# Patient Record
Sex: Female | Born: 1965 | Race: Black or African American | Hispanic: No | Marital: Married | Smoking: Never smoker
Health system: Southern US, Community
[De-identification: ages and names within clinical notes are randomized; demographics above are authoritative.]

## PROBLEM LIST (undated history)

## (undated) DIAGNOSIS — R3129 Other microscopic hematuria: Secondary | ICD-10-CM

## (undated) DIAGNOSIS — E785 Hyperlipidemia, unspecified: Secondary | ICD-10-CM

## (undated) DIAGNOSIS — E669 Obesity, unspecified: Secondary | ICD-10-CM

## (undated) DIAGNOSIS — E119 Type 2 diabetes mellitus without complications: Secondary | ICD-10-CM

## (undated) DIAGNOSIS — E559 Vitamin D deficiency, unspecified: Secondary | ICD-10-CM

## (undated) DIAGNOSIS — R079 Chest pain, unspecified: Secondary | ICD-10-CM

## (undated) DIAGNOSIS — R5381 Other malaise: Secondary | ICD-10-CM

## (undated) DIAGNOSIS — I10 Essential (primary) hypertension: Secondary | ICD-10-CM

## (undated) HISTORY — PX: BREAST BIOPSY: SHX20

## (undated) HISTORY — DX: Other malaise: R53.81

## (undated) HISTORY — DX: Type 2 diabetes mellitus without complications: E11.9

## (undated) HISTORY — DX: Other microscopic hematuria: R31.29

## (undated) HISTORY — DX: Hyperlipidemia, unspecified: E78.5

## (undated) HISTORY — DX: Chest pain, unspecified: R07.9

## (undated) HISTORY — DX: Essential (primary) hypertension: I10

## (undated) HISTORY — PX: ABDOMINAL HYSTERECTOMY: SHX81

## (undated) HISTORY — DX: Vitamin D deficiency, unspecified: E55.9

## (undated) HISTORY — DX: Obesity, unspecified: E66.9

---

## 2000-07-11 HISTORY — PX: FRACTURE SURGERY: SHX138

## 2006-05-24 ENCOUNTER — Ambulatory Visit: Payer: Self-pay

## 2006-06-05 ENCOUNTER — Ambulatory Visit: Payer: Self-pay

## 2008-09-02 ENCOUNTER — Ambulatory Visit: Payer: Self-pay | Admitting: Family Medicine

## 2009-08-01 ENCOUNTER — Emergency Department: Payer: Self-pay | Admitting: Emergency Medicine

## 2009-11-09 ENCOUNTER — Emergency Department: Payer: Self-pay | Admitting: Emergency Medicine

## 2009-12-30 ENCOUNTER — Ambulatory Visit: Payer: Self-pay | Admitting: Obstetrics and Gynecology

## 2010-12-26 ENCOUNTER — Emergency Department: Payer: Self-pay | Admitting: Emergency Medicine

## 2011-05-05 LAB — HM PAP SMEAR

## 2011-05-16 ENCOUNTER — Ambulatory Visit: Payer: Self-pay | Admitting: Family Medicine

## 2011-08-04 ENCOUNTER — Ambulatory Visit: Payer: Self-pay | Admitting: Obstetrics and Gynecology

## 2011-08-04 LAB — CBC
HCT: 37.9 % (ref 35.0–47.0)
HGB: 12.7 g/dL (ref 12.0–16.0)
MCH: 27.5 pg (ref 26.0–34.0)
MCHC: 33.7 g/dL (ref 32.0–36.0)
MCV: 82 fL (ref 80–100)
RBC: 4.62 10*6/uL (ref 3.80–5.20)
WBC: 9.1 10*3/uL (ref 3.6–11.0)

## 2011-08-04 LAB — COMPREHENSIVE METABOLIC PANEL
Alkaline Phosphatase: 61 U/L (ref 50–136)
BUN: 15 mg/dL (ref 7–18)
Bilirubin,Total: 0.4 mg/dL (ref 0.2–1.0)
Calcium, Total: 8.7 mg/dL (ref 8.5–10.1)
Chloride: 103 mmol/L (ref 98–107)
Co2: 27 mmol/L (ref 21–32)
Creatinine: 0.85 mg/dL (ref 0.60–1.30)
EGFR (African American): >60
EGFR (Non-African Amer.): >60
Osmolality: 281 (ref 275–301)
Potassium: 3.4 mmol/L — ABNORMAL LOW (ref 3.5–5.1)
SGPT (ALT): 17 U/L
Sodium: 140 mmol/L (ref 136–145)
Total Protein: 7.7 g/dL (ref 6.4–8.2)

## 2011-09-01 ENCOUNTER — Ambulatory Visit: Payer: Self-pay | Admitting: Obstetrics and Gynecology

## 2011-09-02 LAB — HEMATOCRIT: HCT: 34.2 % — ABNORMAL LOW (ref 35.0–47.0)

## 2012-06-09 ENCOUNTER — Emergency Department: Payer: Self-pay | Admitting: Unknown Physician Specialty

## 2012-06-09 LAB — URINALYSIS, COMPLETE
Nitrite: NEGATIVE
Ph: 5 (ref 4.5–8.0)
Protein: NEGATIVE
RBC,UR: 5 /HPF (ref 0–5)
Squamous Epithelial: 4
WBC UR: 5 /HPF (ref 0–5)

## 2012-06-09 LAB — COMPREHENSIVE METABOLIC PANEL
Albumin: 3.6 g/dL (ref 3.4–5.0)
Alkaline Phosphatase: 87 U/L (ref 50–136)
BUN: 13 mg/dL (ref 7–18)
Bilirubin,Total: 0.2 mg/dL (ref 0.2–1.0)
Co2: 25 mmol/L (ref 21–32)
Creatinine: 0.93 mg/dL (ref 0.60–1.30)
EGFR (African American): >60
EGFR (Non-African Amer.): >60
Glucose: 134 mg/dL — ABNORMAL HIGH (ref 65–99)
Osmolality: 280 (ref 275–301)
Potassium: 3.3 mmol/L — ABNORMAL LOW (ref 3.5–5.1)
SGPT (ALT): 21 U/L (ref 12–78)
Sodium: 139 mmol/L (ref 136–145)
Total Protein: 8.1 g/dL (ref 6.4–8.2)

## 2012-06-09 LAB — CK TOTAL AND CKMB (NOT AT ARMC): CK, Total: 153 U/L (ref 21–215)

## 2012-06-09 LAB — CBC
HCT: 40.9 % (ref 35.0–47.0)
HGB: 13.3 g/dL (ref 12.0–16.0)
MCH: 26.9 pg (ref 26.0–34.0)
MCHC: 32.6 g/dL (ref 32.0–36.0)
MCV: 83 fL (ref 80–100)

## 2012-06-09 LAB — TROPONIN I: Troponin-I: <0.02 ng/mL

## 2012-12-19 LAB — LIPID PANEL
CHOLESTEROL: 212 mg/dL — AB (ref 0–200)
HDL: 61 mg/dL (ref 35–70)
LDL Cholesterol: 134 mg/dL
Triglycerides: 83 mg/dL (ref 40–160)

## 2012-12-19 LAB — HEMOGLOBIN A1C: Hgb A1c MFr Bld: 6.9 % — AB (ref 4.0–6.0)

## 2013-07-03 ENCOUNTER — Emergency Department: Payer: Self-pay | Admitting: Internal Medicine

## 2013-07-03 LAB — RAPID INFLUENZA A&B ANTIGENS

## 2013-07-05 LAB — BETA STREP CULTURE(ARMC)

## 2013-12-03 ENCOUNTER — Emergency Department: Payer: Self-pay | Admitting: Emergency Medicine

## 2013-12-03 LAB — URINALYSIS, COMPLETE
BILIRUBIN, UR: NEGATIVE
Glucose,UR: NEGATIVE mg/dL (ref 0–75)
Ketone: NEGATIVE
NITRITE: NEGATIVE
PROTEIN: NEGATIVE
Ph: 6 (ref 4.5–8.0)
RBC,UR: 7 /HPF (ref 0–5)
Specific Gravity: 1.016 (ref 1.003–1.030)
WBC UR: 1 /HPF (ref 0–5)

## 2013-12-03 LAB — CBC WITH DIFFERENTIAL/PLATELET
Basophil #: 0.1 10*3/uL (ref 0.0–0.1)
Basophil %: 0.5 %
Eosinophil #: 0.3 10*3/uL (ref 0.0–0.7)
Eosinophil %: 2.1 %
HCT: 43.1 % (ref 35.0–47.0)
HGB: 13.9 g/dL (ref 12.0–16.0)
LYMPHS ABS: 3.4 10*3/uL (ref 1.0–3.6)
Lymphocyte %: 28 %
MCH: 27.2 pg (ref 26.0–34.0)
MCHC: 32.3 g/dL (ref 32.0–36.0)
MCV: 84 fL (ref 80–100)
MONOS PCT: 8 %
Monocyte #: 1 x10 3/mm — ABNORMAL HIGH (ref 0.2–0.9)
NEUTROS ABS: 7.5 10*3/uL — AB (ref 1.4–6.5)
Neutrophil %: 61.4 %
Platelet: 317 10*3/uL (ref 150–440)
RBC: 5.11 10*6/uL (ref 3.80–5.20)
RDW: 15.1 % — ABNORMAL HIGH (ref 11.5–14.5)
WBC: 12.2 10*3/uL — AB (ref 3.6–11.0)

## 2013-12-03 LAB — COMPREHENSIVE METABOLIC PANEL
ALBUMIN: 3.7 g/dL (ref 3.4–5.0)
Alkaline Phosphatase: 77 U/L
Anion Gap: 7 (ref 7–16)
BILIRUBIN TOTAL: 0.4 mg/dL (ref 0.2–1.0)
BUN: 10 mg/dL (ref 7–18)
CREATININE: 0.83 mg/dL (ref 0.60–1.30)
Calcium, Total: 9.1 mg/dL (ref 8.5–10.1)
Chloride: 104 mmol/L (ref 98–107)
Co2: 27 mmol/L (ref 21–32)
EGFR (African American): >60
EGFR (Non-African Amer.): >60
GLUCOSE: 85 mg/dL (ref 65–99)
OSMOLALITY: 274 (ref 275–301)
Potassium: 3.3 mmol/L — ABNORMAL LOW (ref 3.5–5.1)
SGOT(AST): 14 U/L — ABNORMAL LOW (ref 15–37)
SGPT (ALT): 16 U/L (ref 12–78)
Sodium: 138 mmol/L (ref 136–145)
TOTAL PROTEIN: 8.1 g/dL (ref 6.4–8.2)

## 2013-12-03 LAB — LIPASE, BLOOD: LIPASE: 178 U/L (ref 73–393)

## 2013-12-03 LAB — TROPONIN I: Troponin-I: <0.02 ng/mL

## 2014-11-02 NOTE — Op Note (Signed)
PATIENT NAME:  Elizabeth Barrett, Elizabeth Barrett MR#:  [id number] DATE OF BIRTH:  09-Jul-1966  DATE OF PROCEDURE:  09/01/2011  PREOPERATIVE DIAGNOSES:  1. Fibroid uterus.  2. Dysmenorrhea.  3. Menorrhagia.   POSTOPERATIVE DIAGNOSES:  1. Fibroid uterus.  2. Dysmenorrhea.  3. Menorrhagia.   PROCEDURE: Laparoscopic supracervical hysterectomy.   SURGEON: Prentice Docker, MD  ASSISTANT: Verlene Mayer, MD  ANESTHESIA: General.   ESTIMATED BLOOD LOSS: 50 mL.  OPERATIVE FLUIDS: 2000 mL.   URINE OUTPUT: 200 mL clear fluid at the end of the procedure.   FINDINGS:  1. Large uterus with multiple fibroids.  2. Normal appearing ovaries bilaterally.   SPECIMEN: Uterus.   COMPLICATIONS: None.   INDICATIONS: Elizabeth Barrett is a 49 year old female who was seen in clinic by me for heavy bleeding. During part of her workup, she was noted to have multiple large uterine fibroids. She was counseled regarding the many different treatment modalities for this issue; however, ultimately she wished to have her uterus removed. She was therefore taken to the operating room.   DETAILS OF PROCEDURE: After the patient was met in the preoperative area, she was taken to the Operating Room. She was placed under general anesthesia, which was found to be adequate. She was positioned in the dorsal supine lithotomy position and prepped and draped in the usual sterile fashion. A sterile speculum was placed in the vagina and a single-tooth tenaculum was affixed to the anterior lip of the cervix and an acorn uterine manipulator was affixed to the tenaculum. The speculum was then removed and a Foley catheter was placed in her bladder.   Attention was turned to the abdomen where a 5 mm infraumbilical incision was made after injecting local anesthetic, and the abdomen was entered via the Optiview trocar method. Entry into the abdomen was verified using opening abdominal pressure. The abdomen was then insufflated with CO2. A left lower quadrant  port was then made under intra-abdominal direct visualization using a 5 mm trocar and a right lower quadrant port was made using an 11 mm trocar, in the same fashion. Attention was then turned to the uterus. The abdomen was surveyed with the above-noted findings. The right round ligament was then transected using the Harmonic followed by the right fallopian tube, the broad ligament was then dissected, and the uterine arteries were skeletonized at the level of the internal os. A bladder flap was then created and the bladder moved out of the operative area. The right uterine arteries were then cauterized using the Kleppinger bipolar cautery device and then transected using the Harmonic. This was completed in a similar fashion on the left side of the uterus. The uterus was then amputated from the cervix using the Harmonic scalpel. The endocervical canal was then cauterized using the Kleppinger bipolar cautery device.   The right lower quadrant port was then replaced with a 15 mm Gynecare morcellator port, and the morcellator was then introduced into the abdomen and the uterus was then removed using the morcellator in its entirety. After this, Arista was then placed over the cervical stump and the pedicle lines on both sides to assure continued hemostasis and then an adhesion barrier was then placed over the remaining cervical stump. The right lower quadrant port was then removed and the fascia was closed using a fascial closure device using  0 Vicryl, the left lower quadrant port was then removed, and the abdomen was desufflated, after ensuring hemostasis. All port sites were closed subcutaneously using 3-0 Monocryl. The right  lower quadrant skin was closed using 3-0 Monocryl in a subcuticular fashion and then all port sites, at the skin, were closed with Dermabond.   A speculum was placed in the vagina and the single-tooth tenaculum and acorn manipulator were removed from the cervix. Hemostasis was assured. The  speculum was then removed.   The patient tolerated the procedure well. Sponge, lap, and needle counts were correct x2. She was given antibiotic prophylaxis in the form of Ancef 2 grams prior to skin incision. She was given VTE prophylaxis in the form of pneumatic compression stockings that placed during the operation and prior to induction of anesthesia.  ____________________________ Will Bonnet, MD sdj:slb D: 09/01/2011 09:35:05 ET T: 09/01/2011 10:13:59 ET JOB#: [id number]  cc: Will Bonnet, MD, <Dictator> Will Bonnet MD ELECTRONICALLY SIGNED 10/02/2011 23:03

## 2015-01-23 ENCOUNTER — Telehealth: Payer: Self-pay

## 2015-01-23 NOTE — Telephone Encounter (Signed)
Patient called asking about her hysterectomy and informed patient to have records pulled from [facility] about procedure.

## 2015-02-16 ENCOUNTER — Encounter: Payer: Self-pay | Admitting: Family Medicine

## 2015-03-10 ENCOUNTER — Encounter: Payer: Self-pay | Admitting: Emergency Medicine

## 2015-03-10 ENCOUNTER — Emergency Department
Admission: EM | Admit: 2015-03-10 | Discharge: 2015-03-10 | Disposition: A | Discharge status: Home / Self Care | Payer: 59 | Attending: Emergency Medicine | Admitting: Emergency Medicine

## 2015-03-10 ENCOUNTER — Emergency Department: Payer: 59

## 2015-03-10 DIAGNOSIS — Y998 Other external cause status: Secondary | ICD-10-CM | POA: Diagnosis not present

## 2015-03-10 DIAGNOSIS — T24201A Burn of second degree of unspecified site of right lower limb, except ankle and foot, initial encounter: Secondary | ICD-10-CM | POA: Diagnosis not present

## 2015-03-10 DIAGNOSIS — Y9389 Activity, other specified: Secondary | ICD-10-CM | POA: Insufficient documentation

## 2015-03-10 DIAGNOSIS — T2125XA Burn of second degree of buttock, initial encounter: Secondary | ICD-10-CM | POA: Diagnosis not present

## 2015-03-10 DIAGNOSIS — T24211A Burn of second degree of right thigh, initial encounter: Secondary | ICD-10-CM

## 2015-03-10 DIAGNOSIS — Y9289 Other specified places as the place of occurrence of the external cause: Secondary | ICD-10-CM | POA: Insufficient documentation

## 2015-03-10 DIAGNOSIS — M62830 Muscle spasm of back: Secondary | ICD-10-CM | POA: Insufficient documentation

## 2015-03-10 DIAGNOSIS — Z23 Encounter for immunization: Secondary | ICD-10-CM | POA: Diagnosis not present

## 2015-03-10 DIAGNOSIS — W92XXXA Exposure to excessive heat of man-made origin, initial encounter: Secondary | ICD-10-CM | POA: Insufficient documentation

## 2015-03-10 MED ORDER — KETOROLAC TROMETHAMINE 30 MG/ML IJ SOLN
INTRAMUSCULAR | Status: AC
  Filled 2015-03-10: qty 1

## 2015-03-10 MED ORDER — HYDROCODONE-ACETAMINOPHEN 5-325 MG PO TABS
1.0000 | ORAL_TABLET | Freq: Once | ORAL | Status: DC
  Filled 2015-03-10: qty 1

## 2015-03-10 MED ORDER — SILVER SULFADIAZINE 1 % EX CREA
TOPICAL_CREAM | Freq: Once | CUTANEOUS | Status: AC
  Administered 2015-03-10: 16:00:00 via TOPICAL
  Filled 2015-03-10: qty 85

## 2015-03-10 MED ORDER — TIZANIDINE HCL 4 MG PO TABS: 4.0000 mg | ORAL_TABLET | Freq: Four times a day (QID) | ORAL | Status: DC | PRN

## 2015-03-10 MED ORDER — TETANUS-DIPHTH-ACELL PERTUSSIS 5-2.5-18.5 LF-MCG/0.5 IM SUSP
0.5000 mL | Freq: Once | INTRAMUSCULAR | Status: AC
  Administered 2015-03-10: 0.5 mL via INTRAMUSCULAR
  Filled 2015-03-10: qty 0.5

## 2015-03-10 MED ORDER — CEPHALEXIN 500 MG PO CAPS: 500.0000 mg | ORAL_CAPSULE | Freq: Four times a day (QID) | ORAL | Status: AC

## 2015-03-10 MED ORDER — SILVER SULFADIAZINE 1 % EX CREA: TOPICAL_CREAM | CUTANEOUS | Status: AC

## 2015-03-10 MED ORDER — KETOROLAC TROMETHAMINE 30 MG/ML IJ SOLN
30.0000 mg | Freq: Once | INTRAMUSCULAR | Status: AC
  Administered 2015-03-10: 30 mg via INTRAMUSCULAR

## 2015-03-10 MED ORDER — IBUPROFEN 800 MG PO TABS: 800.0000 mg | ORAL_TABLET | Freq: Three times a day (TID) | ORAL | Status: DC | PRN

## 2015-03-10 MED ORDER — HYDROCODONE-ACETAMINOPHEN 5-325 MG PO TABS: 1.0000 | ORAL_TABLET | Freq: Four times a day (QID) | ORAL | Status: DC | PRN

## 2015-03-10 NOTE — ED Provider Notes (Signed)
CSN: [id number]     Arrival date & time 03/10/15  1400 History   First MD Initiated Contact with Patient 03/10/15 1439     Chief Complaint  Patient presents with  . Back Pain    HPI Comments: 49 year old female presents today complaining of right sided back pain and flank pain for the past week. Pt reports she started having muscle spasms over a week ago and they have continued. Minimal relief from ibuprofen and muscle relaxer she got from a friend. No loss of bowel or bladder function. No genital or perianal numbness. No numbness or weakness down her legs.   Pt is also concerned because she burned her right hip on the heating pad over the weekend. She mistakenly fell asleep with it on her hip. The area blistered up and some fluid came out of it. She placed a bandage over it but she has not put any cream on it. Unsure of tetanus status.   Patient is a 49 y.o. female presenting with back pain. The history is provided by the patient.  Back Pain Location:  Lumbar spine and thoracic spine Quality:  Cramping Radiates to:  Does not radiate Pain severity:  Moderate Pain is:  Same all the time Onset quality:  Gradual Duration:  7 days Timing:  Intermittent Progression:  Worsening Chronicity:  New Context: not falling, not lifting heavy objects, not occupational injury and not recent injury   Relieved by:  None tried Worsened by:  Ambulation, movement, standing and twisting Ineffective treatments:  Being still, bed rest, NSAIDs, muscle relaxants and heating pad Associated symptoms: no bladder incontinence, no bowel incontinence, no dysuria, no leg pain, no numbness, no perianal numbness, no tingling and no weakness   Risk factors: lack of exercise, menopause and obesity     History reviewed. No pertinent past medical history. Past Surgical History  Procedure Laterality Date  . Abdominal hysterectomy     History reviewed. No pertinent family history. Social History  Substance Use Topics    . Smoking status: Never Smoker   . Smokeless tobacco: None  . Alcohol Use: No   OB History    No data available     Review of Systems  Gastrointestinal: Negative for bowel incontinence.  Genitourinary: Negative for bladder incontinence and dysuria.  Musculoskeletal: Positive for myalgias, back pain and arthralgias.  Skin: Positive for rash and wound.  Neurological: Negative for tingling, weakness and numbness.  All other systems reviewed and are negative.     Allergies  Review of patient's allergies indicates no known allergies.  Home Medications   Prior to Admission medications   Medication Sig Start Date End Date Taking? Authorizing Provider  cephALEXin (KEFLEX) 500 MG capsule Take 1 capsule (500 mg total) by mouth 4 (four) times daily. 03/10/15 03/20/15  Corliss Parish, PA-C  HYDROcodone-acetaminophen (NORCO/VICODIN) 5-325 MG per tablet Take 1 tablet by mouth every 6 (six) hours as needed for severe pain. 03/10/15   Corliss Parish, PA-C  ibuprofen (ADVIL,MOTRIN) 800 MG tablet Take 1 tablet (800 mg total) by mouth every 8 (eight) hours as needed. 03/10/15   Corliss Parish, PA-C  silver sulfADIAZINE (SILVADENE) 1 % cream Apply to affected area BID 03/10/15 03/09/16  Shayne Alken V, PA-C  tiZANidine (ZANAFLEX) 4 MG tablet Take 1 tablet (4 mg total) by mouth every 6 (six) hours as needed for muscle spasms. 03/10/15   Shayne Alken V, PA-C   BP 181/89 mmHg  Pulse 101  Temp(Src)  98.2 F (36.8 C) (Oral)  Resp 18  Ht 5\' 5"  (1.651 m)  Wt 250 lb (113.399 kg)  BMI 41.60 kg/m2  SpO2 97% Physical Exam  Constitutional: She is oriented to person, place, and time. Vital signs are normal. She appears well-developed and well-nourished.  HENT:  Head: Normocephalic and atraumatic.  Abdominal: Soft. Bowel sounds are normal. She exhibits no distension. There is no tenderness. There is no rebound and no guarding.  Musculoskeletal:       Right hip: Normal.       Left hip: Normal.       Lumbar back:  She exhibits decreased range of motion. She exhibits no bony tenderness.  Right sided thoracic and lumbar muscle tenderness with spasms noted.   Neurological: She is alert and oriented to person, place, and time. She has normal strength. No sensory deficit. She exhibits normal muscle tone. Gait normal.  Skin: Skin is warm. Burn noted. There is erythema.     Second degree burns noted just above right buttocks.   Psychiatric: She has a normal mood and affect. Her behavior is normal. Judgment and thought content normal.  Nursing note and vitals reviewed.   ED Course  Procedures (including critical care time) Labs Review Labs Reviewed - No data to display  Imaging Review Dg Lumbar Spine 2-3 Views  03/10/2015   CLINICAL DATA:  Lumbago for 2 weeks  EXAM: LUMBAR SPINE - 2-3 VIEW  COMPARISON:  None.  FINDINGS: Frontal, lateral, and spot lumbosacral lateral images were obtained. There are 4 strictly non-rib-bearing lumbar type vertebral bodies. There is a transitional lumbosacral vertebra. There is no fracture or spondylolisthesis. Disc spaces appear intact. No erosive change.  IMPRESSION: No fracture or spondylolisthesis. No appreciable arthropathic change.   Electronically Signed   By: Lowella Grip III M.D.   On: 03/10/2015 15:31   I have personally reviewed and evaluated these images and lab results as part of my medical decision-making.   EKG Interpretation None      MDM  Normal lumbar spine XRAY Toradol 30mg  IM given in ER Short course of Norco as needed for pain - drowsiness warning given Zanaflex QHS as needed - drowsiness warning given Silvadene applied to burn in ER. Tdap updated Continue silvadene BID - Keflex 500mg  QID to cover for infection Follow up with PCP in 2 days to re-check burn and back spasm  Final diagnoses:  Second degree burn of right hip, initial encounter  Muscle spasm of back        Santo Held 03/10/15 1614  Ahmed Prima, MD 03/11/15  2253

## 2015-03-10 NOTE — Discharge Instructions (Signed)

## 2015-03-10 NOTE — ED Notes (Addendum)
Reports spasms in right lower back.  Denies urinary sx.  x 1 wk.  Worse with movement.

## 2015-03-17 ENCOUNTER — Ambulatory Visit: Payer: Self-pay | Admitting: Family Medicine

## 2015-03-18 ENCOUNTER — Ambulatory Visit (INDEPENDENT_AMBULATORY_CARE_PROVIDER_SITE_OTHER): Payer: 59 | Admitting: Family Medicine

## 2015-03-18 ENCOUNTER — Encounter: Payer: Self-pay | Admitting: Family Medicine

## 2015-03-18 VITALS — BP 132/86 | HR 94 | Temp 99.1°F | Resp 16 | Ht 64.5 in | Wt 254.5 lb

## 2015-03-18 DIAGNOSIS — M549 Dorsalgia, unspecified: Secondary | ICD-10-CM | POA: Insufficient documentation

## 2015-03-18 DIAGNOSIS — E119 Type 2 diabetes mellitus without complications: Secondary | ICD-10-CM | POA: Diagnosis not present

## 2015-03-18 DIAGNOSIS — T300 Burn of unspecified body region, unspecified degree: Secondary | ICD-10-CM

## 2015-03-18 DIAGNOSIS — E1169 Type 2 diabetes mellitus with other specified complication: Secondary | ICD-10-CM | POA: Insufficient documentation

## 2015-03-18 DIAGNOSIS — E559 Vitamin D deficiency, unspecified: Secondary | ICD-10-CM | POA: Diagnosis not present

## 2015-03-18 DIAGNOSIS — Z23 Encounter for immunization: Secondary | ICD-10-CM

## 2015-03-18 DIAGNOSIS — E785 Hyperlipidemia, unspecified: Secondary | ICD-10-CM | POA: Diagnosis not present

## 2015-03-18 DIAGNOSIS — Z79899 Other long term (current) drug therapy: Secondary | ICD-10-CM

## 2015-03-18 LAB — POCT UA - MICROALBUMIN: Microalbumin Ur, POC: 100 mg/L

## 2015-03-18 LAB — POCT GLYCOSYLATED HEMOGLOBIN (HGB A1C): Hemoglobin A1C: 6.2

## 2015-03-18 MED ORDER — TIZANIDINE HCL 4 MG PO TABS: 4.0000 mg | ORAL_TABLET | Freq: Four times a day (QID) | ORAL | Status: DC | PRN

## 2015-03-18 MED ORDER — HYDROCODONE-ACETAMINOPHEN 5-325 MG PO TABS: 1.0000 | ORAL_TABLET | Freq: Four times a day (QID) | ORAL | Status: DC | PRN

## 2015-03-18 NOTE — Progress Notes (Signed)
Name: Elizabeth Barrett   MRN: [id number]    DOB: 06/01/1966   Date:03/18/2015       Progress Note  Subjective  Chief Complaint  Chief Complaint  Patient presents with  . Medication Refill  . Diabetes    Patient states she has been having foot pain.    HPI  Acute low back pain: started about one week ago, pain was severe, difficulty walking and slept with heating pad that caused second degree burn on her skin. She went to [facility], feeling better, taking Ibuprofen, Hydrocodone and Tizanidine. Her pain right now is a 2/10. No symptoms of radiculitis. EC did X-ray and was negative.   Burn: 2nd degree, doing better, no signs of infection, using medication as needed  DMII: lost to follow up, past two years because of gap in insurance, hgbA1C is at goal today, continue life style modification. No polyphagia, polydipsia or polyuria  Obesity: she has changed diet to Atkins diet over the past couple of months, but weight fluctuates. Discussed other diets with her today.    Patient Active Problem List   Diagnosis Date Noted  . Type 2 diabetes, diet controlled 03/18/2015  . Obesity, Class III, BMI 40-49.9 (morbid obesity) 03/18/2015  . Vitamin D deficiency 03/18/2015  . Burn 03/18/2015  . Acute back pain 03/18/2015    Past Surgical History  Procedure Laterality Date  . Abdominal hysterectomy    . Fracture surgery Right 2002    elbow     Family History  Problem Relation Age of Onset  . Hypertension Mother   . Cancer Father     luekemia  . Heart disease Father   . Diabetes Father   . Asthma Son   . Diabetes Son     Social History   Social History  . Marital Status: Married    Spouse Name: N/A  . Number of Children: N/A  . Years of Education: N/A   Occupational History  . Not on file.   Social History Main Topics  . Smoking status: Never Smoker   . Smokeless tobacco: Never Used  . Alcohol Use: 0.0 oz/week    0 Standard drinks or equivalent per week     Comment: occasionally  drinks wine  . Drug Use: No  . Sexual Activity:    Partners: Male   Other Topics Concern  . Not on file   Social History Narrative     Current outpatient prescriptions:  .  cephALEXin (KEFLEX) 500 MG capsule, Take 1 capsule (500 mg total) by mouth 4 (four) times daily., Disp: 40 capsule, Rfl: 0 .  HYDROcodone-acetaminophen (NORCO/VICODIN) 5-325 MG per tablet, Take 1 tablet by mouth every 6 (six) hours as needed for severe pain., Disp: 20 tablet, Rfl: 0 .  ibuprofen (ADVIL,MOTRIN) 800 MG tablet, Take 1 tablet (800 mg total) by mouth every 8 (eight) hours as needed., Disp: 15 tablet, Rfl: 0 .  silver sulfADIAZINE (SILVADENE) 1 % cream, Apply to affected area BID, Disp: 50 g, Rfl: 1 .  tiZANidine (ZANAFLEX) 4 MG tablet, Take 1 tablet (4 mg total) by mouth every 6 (six) hours as needed for muscle spasms., Disp: 60 tablet, Rfl: 0  No Known Allergies   ROS  Constitutional: Negative for fever or weight change.  Respiratory: Negative for cough and shortness of breath.   Cardiovascular: Negative for chest pain or palpitations.  Gastrointestinal: Negative for abdominal pain, no bowel changes.  Musculoskeletal: Negative for gait problem or joint swelling.  Skin: Negative  for rash.  Neurological: Negative for dizziness or headache.  No other specific complaints in a complete review of systems (except as listed in HPI above).  Objective  Filed Vitals:   03/18/15 1111  BP: 132/86  Pulse: 94  Temp: 99.1 F (37.3 C)  TempSrc: Oral  Resp: 16  Height: 5' 4.5" (1.638 m)  Weight: 254 lb 8 oz (115.44 kg)  SpO2: 97%    Body mass index is 43.03 kg/(m^2).  Physical Exam  Constitutional: Patient appears well-developed and well-nourished. Obese  No distress.  HEENT: head atraumatic, normocephalic, pupils equal and reactive to light,neck supple, throat within normal limits Cardiovascular: Normal rate, regular rhythm and normal heart sounds.  No murmur heard. No BLE edema. Pulmonary/Chest:  Effort normal and breath sounds normal. No respiratory distress. Abdominal: Soft.  There is no tenderness. Psychiatric: Patient has a normal mood and affect. behavior is normal. Judgment and thought content normal. Skin: healing 2nd degree burn on right lower back and flank area Muscular Skeletal: right low back pain, negative straight leg raise  Recent Results (from the past 2160 hour(s))  POCT HgB A1C     Status: None   Collection Time: 03/18/15 11:23 AM  Result Value Ref Range   Hemoglobin A1C 6.2   POCT UA - Microalbumin     Status: None   Collection Time: 03/18/15 11:24 AM  Result Value Ref Range   Microalbumin Ur, POC 100 mg/L   Creatinine, POC  mg/dL   Albumin/Creatinine Ratio, Urine, POC      Diabetic Foot Exam: Diabetic Foot Exam - Simple   Simple Foot Form  Visual Inspection  No deformities, no ulcerations, no other skin breakdown bilaterally:  Yes  Sensation Testing  Intact to touch and monofilament testing bilaterally:  Yes  Pulse Check  Posterior Tibialis and Dorsalis pulse intact bilaterally:  Yes  Comments       PHQ2/9: Depression screen PHQ 2/9 03/18/2015  Decreased Interest 0  Down, Depressed, Hopeless 0  PHQ - 2 Score 0    Fall Risk: Fall Risk  03/18/2015  Falls in the past year? No      Assessment & Plan  1. Type 2 diabetes mellitus without complication Diet controlled, explained importance of having yearly exam - POCT HgB A1C - POCT UA - Microalbumin  2. Needs flu shot  - Flu Vaccine QUAD 36+ mos IM  3. Obesity, Class III, BMI 40-49.9 (morbid obesity) Discussed with the patient the risk posed by an increased BMI. Discussed importance of portion control, calorie counting and at least 150 minutes of physical activity weekly. Avoid sweet beverages and drink more water. Eat at least 6 servings of fruit and vegetables daily   4. Vitamin D deficiency  - Vit D  25 hydroxy (rtn osteoporosis monitoring)  5. Burn Doing well  6. Acute back  pain Doing better, discussed PT or chiropractor to avoid recurrence but she wants to hold off - tiZANidine (ZANAFLEX) 4 MG tablet; Take 1 tablet (4 mg total) by mouth every 6 (six) hours as needed for muscle spasms.  Dispense: 60 tablet; Refill: 0 - HYDROcodone-acetaminophen (NORCO/VICODIN) 5-325 MG per tablet; Take 1 tablet by mouth every 6 (six) hours as needed for severe pain.  Dispense: 20 tablet; Refill: 0  7. Dyslipidemia  - Lipid panel  8. Long-term use of high-risk medication  - Comprehensive metabolic panel  9. Need for pneumococcal vaccination  - Pneumococcal polysaccharide vaccine 23-valent greater than or equal to 2yo subcutaneous/IM

## 2015-05-28 ENCOUNTER — Encounter: Payer: 59 | Admitting: Family Medicine

## 2015-08-25 ENCOUNTER — Encounter: Payer: 59 | Admitting: Family Medicine

## 2015-09-16 ENCOUNTER — Ambulatory Visit: Payer: 59 | Admitting: Family Medicine

## 2016-02-10 ENCOUNTER — Other Ambulatory Visit: Payer: Self-pay | Admitting: Internal Medicine

## 2016-02-10 DIAGNOSIS — Z1239 Encounter for other screening for malignant neoplasm of breast: Secondary | ICD-10-CM

## 2016-03-03 ENCOUNTER — Ambulatory Visit: Payer: 59 | Attending: Internal Medicine

## 2016-03-08 ENCOUNTER — Ambulatory Visit
Admission: RE | Admit: 2016-03-08 | Discharge: 2016-03-08 | Disposition: A | Discharge status: Home / Self Care | Payer: 59 | Source: Ambulatory Visit | Attending: Internal Medicine | Admitting: Internal Medicine

## 2016-03-08 ENCOUNTER — Other Ambulatory Visit: Payer: Self-pay | Admitting: Internal Medicine

## 2016-03-08 DIAGNOSIS — Z1239 Encounter for other screening for malignant neoplasm of breast: Secondary | ICD-10-CM

## 2016-03-08 DIAGNOSIS — Z1231 Encounter for screening mammogram for malignant neoplasm of breast: Secondary | ICD-10-CM | POA: Insufficient documentation

## 2016-04-20 ENCOUNTER — Ambulatory Visit: Admission: RE | Admit: 2016-04-20 | Payer: 59 | Source: Ambulatory Visit | Admitting: Unknown Physician Specialty

## 2016-04-20 ENCOUNTER — Encounter: Admission: RE | Payer: Self-pay | Source: Ambulatory Visit

## 2016-04-20 SURGERY — COLONOSCOPY WITH PROPOFOL
Anesthesia: General

## 2016-07-25 ENCOUNTER — Encounter
Admission: RE | Disposition: A | Discharge status: Home / Self Care | Payer: Self-pay | Source: Ambulatory Visit | Attending: Unknown Physician Specialty

## 2016-07-25 ENCOUNTER — Ambulatory Visit: Payer: 59 | Admitting: Anesthesiology

## 2016-07-25 ENCOUNTER — Encounter: Payer: Self-pay | Admitting: *Deleted

## 2016-07-25 ENCOUNTER — Ambulatory Visit
Admission: RE | Admit: 2016-07-25 | Discharge: 2016-07-25 | Disposition: A | Discharge status: Home / Self Care | Payer: 59 | Source: Ambulatory Visit | Attending: Unknown Physician Specialty | Admitting: Unknown Physician Specialty

## 2016-07-25 DIAGNOSIS — K640 First degree hemorrhoids: Secondary | ICD-10-CM | POA: Insufficient documentation

## 2016-07-25 DIAGNOSIS — Z90710 Acquired absence of both cervix and uterus: Secondary | ICD-10-CM | POA: Diagnosis not present

## 2016-07-25 DIAGNOSIS — K648 Other hemorrhoids: Secondary | ICD-10-CM | POA: Diagnosis not present

## 2016-07-25 DIAGNOSIS — K635 Polyp of colon: Secondary | ICD-10-CM | POA: Diagnosis not present

## 2016-07-25 DIAGNOSIS — E559 Vitamin D deficiency, unspecified: Secondary | ICD-10-CM | POA: Insufficient documentation

## 2016-07-25 DIAGNOSIS — K5730 Diverticulosis of large intestine without perforation or abscess without bleeding: Secondary | ICD-10-CM | POA: Diagnosis not present

## 2016-07-25 DIAGNOSIS — R3129 Other microscopic hematuria: Secondary | ICD-10-CM | POA: Insufficient documentation

## 2016-07-25 DIAGNOSIS — D122 Benign neoplasm of ascending colon: Secondary | ICD-10-CM | POA: Insufficient documentation

## 2016-07-25 DIAGNOSIS — Z806 Family history of leukemia: Secondary | ICD-10-CM | POA: Diagnosis not present

## 2016-07-25 DIAGNOSIS — Z825 Family history of asthma and other chronic lower respiratory diseases: Secondary | ICD-10-CM | POA: Diagnosis not present

## 2016-07-25 DIAGNOSIS — Z79899 Other long term (current) drug therapy: Secondary | ICD-10-CM | POA: Diagnosis not present

## 2016-07-25 DIAGNOSIS — Z8249 Family history of ischemic heart disease and other diseases of the circulatory system: Secondary | ICD-10-CM | POA: Insufficient documentation

## 2016-07-25 DIAGNOSIS — Z833 Family history of diabetes mellitus: Secondary | ICD-10-CM | POA: Diagnosis not present

## 2016-07-25 DIAGNOSIS — E785 Hyperlipidemia, unspecified: Secondary | ICD-10-CM | POA: Diagnosis not present

## 2016-07-25 DIAGNOSIS — E119 Type 2 diabetes mellitus without complications: Secondary | ICD-10-CM | POA: Insufficient documentation

## 2016-07-25 DIAGNOSIS — Z1211 Encounter for screening for malignant neoplasm of colon: Secondary | ICD-10-CM | POA: Diagnosis not present

## 2016-07-25 DIAGNOSIS — Z6841 Body Mass Index (BMI) 40.0 and over, adult: Secondary | ICD-10-CM | POA: Diagnosis not present

## 2016-07-25 DIAGNOSIS — Z8371 Family history of colonic polyps: Secondary | ICD-10-CM | POA: Diagnosis not present

## 2016-07-25 DIAGNOSIS — I10 Essential (primary) hypertension: Secondary | ICD-10-CM | POA: Insufficient documentation

## 2016-07-25 DIAGNOSIS — E669 Obesity, unspecified: Secondary | ICD-10-CM | POA: Insufficient documentation

## 2016-07-25 HISTORY — PX: COLONOSCOPY WITH PROPOFOL: SHX5780

## 2016-07-25 SURGERY — COLONOSCOPY WITH PROPOFOL
Anesthesia: General

## 2016-07-25 MED ORDER — MIDAZOLAM HCL 5 MG/5ML IJ SOLN
INTRAMUSCULAR | Status: DC | PRN
  Administered 2016-07-25 (×2): 1 mg via INTRAVENOUS

## 2016-07-25 MED ORDER — PROPOFOL 500 MG/50ML IV EMUL
INTRAVENOUS | Status: AC
  Filled 2016-07-25: qty 50

## 2016-07-25 MED ORDER — PROPOFOL 500 MG/50ML IV EMUL
INTRAVENOUS | Status: DC | PRN
  Administered 2016-07-25: 75 ug/kg/min via INTRAVENOUS

## 2016-07-25 MED ORDER — SODIUM CHLORIDE 0.9 % IV SOLN: INTRAVENOUS | Status: DC

## 2016-07-25 MED ORDER — LIDOCAINE 2% (20 MG/ML) 5 ML SYRINGE
INTRAMUSCULAR | Status: AC
  Filled 2016-07-25: qty 5

## 2016-07-25 MED ORDER — PROPOFOL 10 MG/ML IV BOLUS
INTRAVENOUS | Status: DC | PRN
  Administered 2016-07-25: 30 mg via INTRAVENOUS
  Administered 2016-07-25: 20 mg via INTRAVENOUS

## 2016-07-25 MED ORDER — FENTANYL CITRATE (PF) 100 MCG/2ML IJ SOLN
INTRAMUSCULAR | Status: DC | PRN
  Administered 2016-07-25: 50 ug via INTRAVENOUS

## 2016-07-25 MED ORDER — FENTANYL CITRATE (PF) 100 MCG/2ML IJ SOLN
INTRAMUSCULAR | Status: AC
  Filled 2016-07-25: qty 2

## 2016-07-25 MED ORDER — LIDOCAINE HCL (PF) 2 % IJ SOLN
INTRAMUSCULAR | Status: DC | PRN
  Administered 2016-07-25: 50 mg

## 2016-07-25 MED ORDER — SODIUM CHLORIDE 0.9 % IV SOLN
INTRAVENOUS | Status: DC
  Administered 2016-07-25: 10:00:00 via INTRAVENOUS

## 2016-07-25 MED ORDER — MIDAZOLAM HCL 2 MG/2ML IJ SOLN
INTRAMUSCULAR | Status: AC
  Filled 2016-07-25: qty 2

## 2016-07-25 NOTE — H&P (Signed)
   Primary Care Physician:  Glendon Axe, MD Primary Gastroenterologist:  Dr. Vira Agar  Pre-Procedure History & Physical: HPI:  Elizabeth Barrett is a 51 y.o. female is here for an colonoscopy.   Past Medical History:  Diagnosis Date  . Chest pain   . Diabetes mellitus without complication ([place])   . Hyperlipidemia   . Hypertension   . Malaise   . Microscopic hematuria   . Obesity   . Vitamin D deficiency     Past Surgical History:  Procedure Laterality Date  . ABDOMINAL HYSTERECTOMY    . BREAST BIOPSY Left 6-7 YRS AGO   NEG  . FRACTURE SURGERY Right 2002   elbow     Prior to Admission medications   Medication Sig Start Date End Date Taking? Authorizing Provider  HYDROcodone-acetaminophen (NORCO/VICODIN) 5-325 MG per tablet Take 1 tablet by mouth every 6 (six) hours as needed for severe pain. Patient not taking: Reported on 07/25/2016 03/18/15   Steele Sizer, MD  ibuprofen (ADVIL,MOTRIN) 800 MG tablet Take 1 tablet (800 mg total) by mouth every 8 (eight) hours as needed. 03/10/15   Harvest Dark, PA-C  tiZANidine (ZANAFLEX) 4 MG tablet Take 1 tablet (4 mg total) by mouth every 6 (six) hours as needed for muscle spasms. Patient not taking: Reported on 07/25/2016 03/18/15   Steele Sizer, MD    Allergies as of 07/07/2016  . (No Known Allergies)    Family History  Problem Relation Age of Onset  . Hypertension Mother   . Cancer Father     luekemia  . Heart disease Father   . Diabetes Father   . Asthma Son   . Diabetes Son   . Breast cancer Neg Hx     Social History   Social History  . Marital status: Married    Spouse name: N/A  . Number of children: N/A  . Years of education: N/A   Occupational History  . Not on file.   Social History Main Topics  . Smoking status: Never Smoker  . Smokeless tobacco: Never Used  . Alcohol use 0.0 oz/week     Comment: occasionally drinks wine  . Drug use: No  . Sexual activity: Yes    Partners: Male   Other Topics Concern   . Not on file   Social History Narrative  . No narrative on file    Review of Systems: See HPI, otherwise negative ROS  Physical Exam: BP (!) 116/92   Pulse 86   Temp 97.8 F (36.6 C) (Tympanic)   Resp 16   Ht 5\' 4"  (1.626 m)   Wt 115.2 kg (254 lb)   SpO2 95%   BMI 43.60 kg/m  General:   Alert,  pleasant and cooperative in NAD Head:  Normocephalic and atraumatic. Neck:  Supple; no masses or thyromegaly. Lungs:  Clear throughout to auscultation.    Heart:  Regular rate and rhythm. Abdomen:  Soft, nontender and nondistended. Normal bowel sounds, without guarding, and without rebound.   Neurologic:  Alert and  oriented x4;  grossly normal neurologically.  Impression/Plan: Elizabeth Barrett is here for an colonoscopy to be performed for screening due to family history of colon polyps in both parents.  Risks, benefits, limitations, and alternatives regarding  colonoscopy have been reviewed with the patient.  Questions have been answered.  All parties agreeable.   Gaylyn Cheers, MD  07/25/2016, 10:58 AM

## 2016-07-25 NOTE — Op Note (Signed)
[facility] Gastroenterology Patient Name: Elizabeth Barrett Procedure Date: 07/25/2016 10:59 AM MRN: [id number] Account #: [id number] Date of Birth: [birth year] Admit Type: Outpatient Age: 51 Room: [facility] ENDO ROOM 1 Gender: Female Note Status: Finalized Procedure:            Colonoscopy Indications:          Colon cancer screening in patient at increased risk:                        Family history of 1st-degree relative with colon polyps Providers:            Manya Silvas, MD Referring MD:         Glendon Axe (Referring MD) Medicines:            Propofol per Anesthesia Complications:        No immediate complications. Procedure:            Pre-Anesthesia Assessment:                       - After reviewing the risks and benefits, the patient                        was deemed in satisfactory condition to undergo the                        procedure.                       After obtaining informed consent, the colonoscope was                        passed under direct vision. Throughout the procedure,                        the patient's blood pressure, pulse, and oxygen                        saturations were monitored continuously. The                        Colonoscope was introduced through the anus and                        advanced to the the cecum, identified by appendiceal                        orifice and ileocecal valve. The colonoscopy was                        performed without difficulty. The patient tolerated the                        procedure well. The quality of the bowel preparation                        was excellent. Findings:      A small polyp was found in the ascending colon. The polyp was sessile.       The polyp was removed with a hot snare. Resection and retrieval were       complete.      A  single small-mouthed diverticulum was found in the sigmoid colon.      Internal hemorrhoids were found during endoscopy. The hemorrhoids were      small and Grade I (internal hemorrhoids that do not prolapse).      The exam was otherwise without abnormality. Impression:           - One small polyp in the ascending colon, removed with                        a hot snare. Resected and retrieved.                       - Diverticulosis in the sigmoid colon.                       - Internal hemorrhoids.                       - The examination was otherwise normal. Recommendation:       - Await pathology results. Manya Silvas, MD 07/25/2016 11:25:31 AM This report has been signed electronically. Number of Addenda: 0 Note Initiated On: 07/25/2016 10:59 AM Scope Withdrawal Time: 0 hours 12 minutes 32 seconds  Total Procedure Duration: 0 hours 16 minutes 59 seconds       [facility]

## 2016-07-25 NOTE — Anesthesia Postprocedure Evaluation (Signed)
Anesthesia Post Note  Patient: Elizabeth Barrett  Procedure(s) Performed: Procedure(s) (LRB): COLONOSCOPY WITH PROPOFOL (N/A)  Patient location during evaluation: Endoscopy Anesthesia Type: General Level of consciousness: awake and alert Pain management: pain level controlled Vital Signs Assessment: post-procedure vital signs reviewed and stable Respiratory status: spontaneous breathing, nonlabored ventilation, respiratory function stable and patient connected to nasal cannula oxygen Cardiovascular status: blood pressure returned to baseline and stable Postop Assessment: no signs of nausea or vomiting Anesthetic complications: no     Last Vitals:  Vitals:   07/25/16 1149 07/25/16 1159  BP: (!) 150/80 132/90  Pulse: 72 76  Resp: 18 (!) 24  Temp:      Last Pain:  Vitals:   07/25/16 1129  TempSrc: Tympanic                 Precious Haws Kellie Murrill

## 2016-07-25 NOTE — Transfer of Care (Signed)
Immediate Anesthesia Transfer of Care Note  Patient: Elizabeth Barrett  Procedure(s) Performed: Procedure(s): COLONOSCOPY WITH PROPOFOL (N/A)  Patient Location: PACU  Anesthesia Type:General  Level of Consciousness: sedated  Airway & Oxygen Therapy: Patient Spontanous Breathing and Patient connected to nasal cannula oxygen  Post-op Assessment: Report given to RN and Post -op Vital signs reviewed and stable  Post vital signs: Reviewed and stable  Last Vitals:  Vitals:   07/25/16 1000 07/25/16 1129  BP: (!) 116/92   Pulse: 86   Resp: 16   Temp: 36.6 C (P) 36.5 C    Last Pain:  Vitals:   07/25/16 1129  TempSrc: (P) Tympanic         Complications: No apparent anesthesia complications

## 2016-07-25 NOTE — Anesthesia Preprocedure Evaluation (Addendum)
Anesthesia Evaluation  Patient identified by MRN, date of birth, ID band Patient awake    Reviewed: Allergy & Precautions, H&P , NPO status , Patient's Chart, lab work & pertinent test results  History of Anesthesia Complications Negative for: history of anesthetic complications  Airway Mallampati: II  TM Distance: >3 FB Neck ROM: full    Dental  (+) Poor Dentition, Chipped   Pulmonary neg pulmonary ROS, neg shortness of breath,    Pulmonary exam normal breath sounds clear to auscultation       Cardiovascular Exercise Tolerance: Good hypertension, (-) angina(-) Past MI and (-) DOE Normal cardiovascular exam Rhythm:regular Rate:Normal     Neuro/Psych negative neurological ROS  negative psych ROS   GI/Hepatic negative GI ROS, Neg liver ROS, neg GERD  ,  Endo/Other  diabetes, Type 2  Renal/GU negative Renal ROS  negative genitourinary   Musculoskeletal   Abdominal   Peds  Hematology negative hematology ROS (+)   Anesthesia Other Findings Signs and symptoms suggestive of sleep apnea   Past Medical History: No date: Chest pain No date: Diabetes mellitus without complication (HCC) No date: Hyperlipidemia No date: Hypertension No date: Malaise No date: Microscopic hematuria No date: Obesity No date: Vitamin D deficiency  Past Surgical History: No date: ABDOMINAL HYSTERECTOMY 6-7 YRS AGO: BREAST BIOPSY Left     Comment: NEG 2002: FRACTURE SURGERY Right     Comment: elbow   BMI    Body Mass Index:  43.60 kg/m      Reproductive/Obstetrics negative OB ROS                            Anesthesia Physical Anesthesia Plan  ASA: III  Anesthesia Plan: General   Post-op Pain Management:    Induction:   Airway Management Planned:   Additional Equipment:   Intra-op Plan:   Post-operative Plan:   Informed Consent: I have reviewed the patients History and Physical, chart, labs  and discussed the procedure including the risks, benefits and alternatives for the proposed anesthesia with the patient or authorized representative who has indicated his/her understanding and acceptance.   Dental Advisory Given  Plan Discussed with: Anesthesiologist, CRNA and Surgeon  Anesthesia Plan Comments:         Anesthesia Quick Evaluation

## 2016-07-26 ENCOUNTER — Encounter: Payer: Self-pay | Admitting: Unknown Physician Specialty

## 2016-07-26 LAB — SURGICAL PATHOLOGY

## 2017-07-23 DIAGNOSIS — J40 Bronchitis, not specified as acute or chronic: Secondary | ICD-10-CM | POA: Diagnosis not present

## 2017-08-18 DIAGNOSIS — R072 Precordial pain: Secondary | ICD-10-CM | POA: Diagnosis not present

## 2017-08-18 DIAGNOSIS — E119 Type 2 diabetes mellitus without complications: Secondary | ICD-10-CM | POA: Diagnosis not present

## 2017-08-18 DIAGNOSIS — I10 Essential (primary) hypertension: Secondary | ICD-10-CM | POA: Diagnosis not present

## 2017-08-23 DIAGNOSIS — I10 Essential (primary) hypertension: Secondary | ICD-10-CM | POA: Diagnosis not present

## 2017-08-23 DIAGNOSIS — R0602 Shortness of breath: Secondary | ICD-10-CM | POA: Diagnosis not present

## 2017-08-23 DIAGNOSIS — I208 Other forms of angina pectoris: Secondary | ICD-10-CM | POA: Diagnosis not present

## 2017-09-04 DIAGNOSIS — R0602 Shortness of breath: Secondary | ICD-10-CM | POA: Diagnosis not present

## 2017-09-04 DIAGNOSIS — I208 Other forms of angina pectoris: Secondary | ICD-10-CM | POA: Diagnosis not present

## 2017-09-13 DIAGNOSIS — J180 Bronchopneumonia, unspecified organism: Secondary | ICD-10-CM | POA: Diagnosis not present

## 2017-09-14 DIAGNOSIS — I208 Other forms of angina pectoris: Secondary | ICD-10-CM | POA: Diagnosis not present

## 2017-09-14 DIAGNOSIS — R0602 Shortness of breath: Secondary | ICD-10-CM | POA: Diagnosis not present

## 2017-09-14 DIAGNOSIS — I10 Essential (primary) hypertension: Secondary | ICD-10-CM | POA: Diagnosis not present

## 2017-10-12 DIAGNOSIS — Z780 Asymptomatic menopausal state: Secondary | ICD-10-CM | POA: Diagnosis not present

## 2017-10-12 DIAGNOSIS — I10 Essential (primary) hypertension: Secondary | ICD-10-CM | POA: Diagnosis not present

## 2017-10-12 DIAGNOSIS — Z1231 Encounter for screening mammogram for malignant neoplasm of breast: Secondary | ICD-10-CM | POA: Diagnosis not present

## 2017-10-12 DIAGNOSIS — Z0000 Encounter for general adult medical examination without abnormal findings: Secondary | ICD-10-CM | POA: Diagnosis not present

## 2017-10-12 DIAGNOSIS — E119 Type 2 diabetes mellitus without complications: Secondary | ICD-10-CM | POA: Diagnosis not present

## 2017-10-17 DIAGNOSIS — Z780 Asymptomatic menopausal state: Secondary | ICD-10-CM | POA: Diagnosis not present

## 2017-10-24 ENCOUNTER — Other Ambulatory Visit: Payer: Self-pay | Admitting: Internal Medicine

## 2017-10-24 DIAGNOSIS — Z1231 Encounter for screening mammogram for malignant neoplasm of breast: Secondary | ICD-10-CM

## 2017-11-14 ENCOUNTER — Ambulatory Visit
Admission: RE | Admit: 2017-11-14 | Discharge: 2017-11-14 | Disposition: A | Discharge status: Home / Self Care | Payer: 59 | Source: Ambulatory Visit | Attending: Internal Medicine | Admitting: Internal Medicine

## 2017-11-14 DIAGNOSIS — Z1231 Encounter for screening mammogram for malignant neoplasm of breast: Secondary | ICD-10-CM

## 2018-01-29 DIAGNOSIS — E119 Type 2 diabetes mellitus without complications: Secondary | ICD-10-CM | POA: Diagnosis not present

## 2018-02-05 DIAGNOSIS — E119 Type 2 diabetes mellitus without complications: Secondary | ICD-10-CM | POA: Diagnosis not present

## 2018-02-05 DIAGNOSIS — M7661 Achilles tendinitis, right leg: Secondary | ICD-10-CM | POA: Diagnosis not present

## 2018-02-05 DIAGNOSIS — I10 Essential (primary) hypertension: Secondary | ICD-10-CM | POA: Diagnosis not present

## 2018-03-26 DIAGNOSIS — R0602 Shortness of breath: Secondary | ICD-10-CM | POA: Diagnosis not present

## 2018-03-26 DIAGNOSIS — G4733 Obstructive sleep apnea (adult) (pediatric): Secondary | ICD-10-CM | POA: Diagnosis not present

## 2018-04-06 DIAGNOSIS — M7731 Calcaneal spur, right foot: Secondary | ICD-10-CM | POA: Diagnosis not present

## 2018-04-06 DIAGNOSIS — M79671 Pain in right foot: Secondary | ICD-10-CM | POA: Diagnosis not present

## 2018-04-06 DIAGNOSIS — M7661 Achilles tendinitis, right leg: Secondary | ICD-10-CM | POA: Diagnosis not present

## 2018-04-11 DIAGNOSIS — R05 Cough: Secondary | ICD-10-CM | POA: Diagnosis not present

## 2018-04-11 DIAGNOSIS — R0602 Shortness of breath: Secondary | ICD-10-CM | POA: Diagnosis not present

## 2018-04-26 ENCOUNTER — Emergency Department: Payer: 59

## 2018-04-26 ENCOUNTER — Encounter: Payer: Self-pay | Admitting: Emergency Medicine

## 2018-04-26 ENCOUNTER — Other Ambulatory Visit: Payer: Self-pay

## 2018-04-26 ENCOUNTER — Emergency Department
Admission: EM | Admit: 2018-04-26 | Discharge: 2018-04-27 | Disposition: A | Discharge status: Home / Self Care | Payer: 59 | Attending: Emergency Medicine | Admitting: Emergency Medicine

## 2018-04-26 DIAGNOSIS — R0602 Shortness of breath: Secondary | ICD-10-CM | POA: Diagnosis not present

## 2018-04-26 DIAGNOSIS — M79605 Pain in left leg: Secondary | ICD-10-CM

## 2018-04-26 DIAGNOSIS — R5383 Other fatigue: Secondary | ICD-10-CM | POA: Insufficient documentation

## 2018-04-26 DIAGNOSIS — E119 Type 2 diabetes mellitus without complications: Secondary | ICD-10-CM | POA: Diagnosis not present

## 2018-04-26 DIAGNOSIS — M79662 Pain in left lower leg: Secondary | ICD-10-CM | POA: Diagnosis not present

## 2018-04-26 DIAGNOSIS — I10 Essential (primary) hypertension: Secondary | ICD-10-CM | POA: Insufficient documentation

## 2018-04-26 LAB — CBC
HEMATOCRIT: 41.3 % (ref 36.0–46.0)
HEMOGLOBIN: 13.2 g/dL (ref 12.0–15.0)
MCH: 27.3 pg (ref 26.0–34.0)
MCHC: 32 g/dL (ref 30.0–36.0)
MCV: 85.3 fL (ref 80.0–100.0)
NRBC: 0 % (ref 0.0–0.2)
Platelets: 349 10*3/uL (ref 150–400)
RBC: 4.84 MIL/uL (ref 3.87–5.11)
RDW: 15.2 % (ref 11.5–15.5)
WBC: 13.1 10*3/uL — AB (ref 4.0–10.5)

## 2018-04-26 LAB — BASIC METABOLIC PANEL
Anion gap: 10 (ref 5–15)
BUN: 16 mg/dL (ref 6–20)
CHLORIDE: 102 mmol/L (ref 98–111)
CO2: 28 mmol/L (ref 22–32)
Calcium: 9 mg/dL (ref 8.9–10.3)
Creatinine, Ser: 0.9 mg/dL (ref 0.44–1.00)
GFR calc non Af Amer: >60 mL/min (ref >60)
Glucose, Bld: 110 mg/dL — ABNORMAL HIGH (ref 70–99)
POTASSIUM: 3.5 mmol/L (ref 3.5–5.1)
SODIUM: 140 mmol/L (ref 135–145)

## 2018-04-26 LAB — TROPONIN I: Troponin I: <0.03 ng/mL (ref <0.03)

## 2018-04-26 NOTE — ED Notes (Signed)
Resumed care from christina rn.  Pt in u/s

## 2018-04-26 NOTE — ED Notes (Signed)
Patient denies pain and is resting comfortably.  

## 2018-04-26 NOTE — ED Triage Notes (Signed)
Pt in via POV with complaints intermittent shortness of breath and fatigue x approximately 8 months.  Pt has been seen by cardiology and pulmonology without any remarkable results.  Pt ambulatory to triage, vitals WDL, NAD noted at this time.

## 2018-04-26 NOTE — ED Notes (Signed)
Pt return from u/s  Family with pt. nsr on monitor.
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
## 2018-04-27 LAB — TSH: TSH: 2.235 u[IU]/mL (ref 0.350–4.500)

## 2018-04-27 NOTE — ED Notes (Signed)
ED Provider at bedside. 

## 2018-05-08 DIAGNOSIS — E119 Type 2 diabetes mellitus without complications: Secondary | ICD-10-CM | POA: Diagnosis not present

## 2018-05-10 DIAGNOSIS — E119 Type 2 diabetes mellitus without complications: Secondary | ICD-10-CM | POA: Diagnosis not present

## 2018-05-10 DIAGNOSIS — I10 Essential (primary) hypertension: Secondary | ICD-10-CM | POA: Diagnosis not present

## 2018-05-10 DIAGNOSIS — Z23 Encounter for immunization: Secondary | ICD-10-CM | POA: Diagnosis not present

## 2018-06-26 DIAGNOSIS — R0602 Shortness of breath: Secondary | ICD-10-CM | POA: Diagnosis not present

## 2018-06-26 DIAGNOSIS — G4733 Obstructive sleep apnea (adult) (pediatric): Secondary | ICD-10-CM | POA: Diagnosis not present

## 2018-06-27 DIAGNOSIS — R0602 Shortness of breath: Secondary | ICD-10-CM | POA: Diagnosis not present

## 2018-06-27 DIAGNOSIS — G4733 Obstructive sleep apnea (adult) (pediatric): Secondary | ICD-10-CM | POA: Diagnosis not present

## 2018-09-04 DIAGNOSIS — R05 Cough: Secondary | ICD-10-CM | POA: Diagnosis not present

## 2018-09-04 DIAGNOSIS — J069 Acute upper respiratory infection, unspecified: Secondary | ICD-10-CM | POA: Diagnosis not present

## 2018-09-04 DIAGNOSIS — R0982 Postnasal drip: Secondary | ICD-10-CM | POA: Diagnosis not present

## 2018-10-01 DIAGNOSIS — R0789 Other chest pain: Secondary | ICD-10-CM | POA: Diagnosis not present

## 2018-10-01 DIAGNOSIS — R0602 Shortness of breath: Secondary | ICD-10-CM | POA: Diagnosis not present

## 2018-10-01 DIAGNOSIS — J4520 Mild intermittent asthma, uncomplicated: Secondary | ICD-10-CM | POA: Diagnosis not present

## 2018-10-10 DIAGNOSIS — G4733 Obstructive sleep apnea (adult) (pediatric): Secondary | ICD-10-CM | POA: Diagnosis not present

## 2018-10-11 DIAGNOSIS — G4733 Obstructive sleep apnea (adult) (pediatric): Secondary | ICD-10-CM | POA: Diagnosis not present

## 2018-10-11 DIAGNOSIS — R0602 Shortness of breath: Secondary | ICD-10-CM | POA: Diagnosis not present

## 2018-11-09 DIAGNOSIS — G4733 Obstructive sleep apnea (adult) (pediatric): Secondary | ICD-10-CM | POA: Diagnosis not present

## 2019-05-14 IMAGING — CR DG CHEST 2V
1 series · 2 of 2 positions shown · non-contrast
Comparison: 06/09/2012

CLINICAL DATA: Shortness of breath

EXAM:
CHEST - 2 VIEW

[Series 1: dg chest 2 view · 0.14mm/px · 2 of 2 slices shown]
[im 1/2]
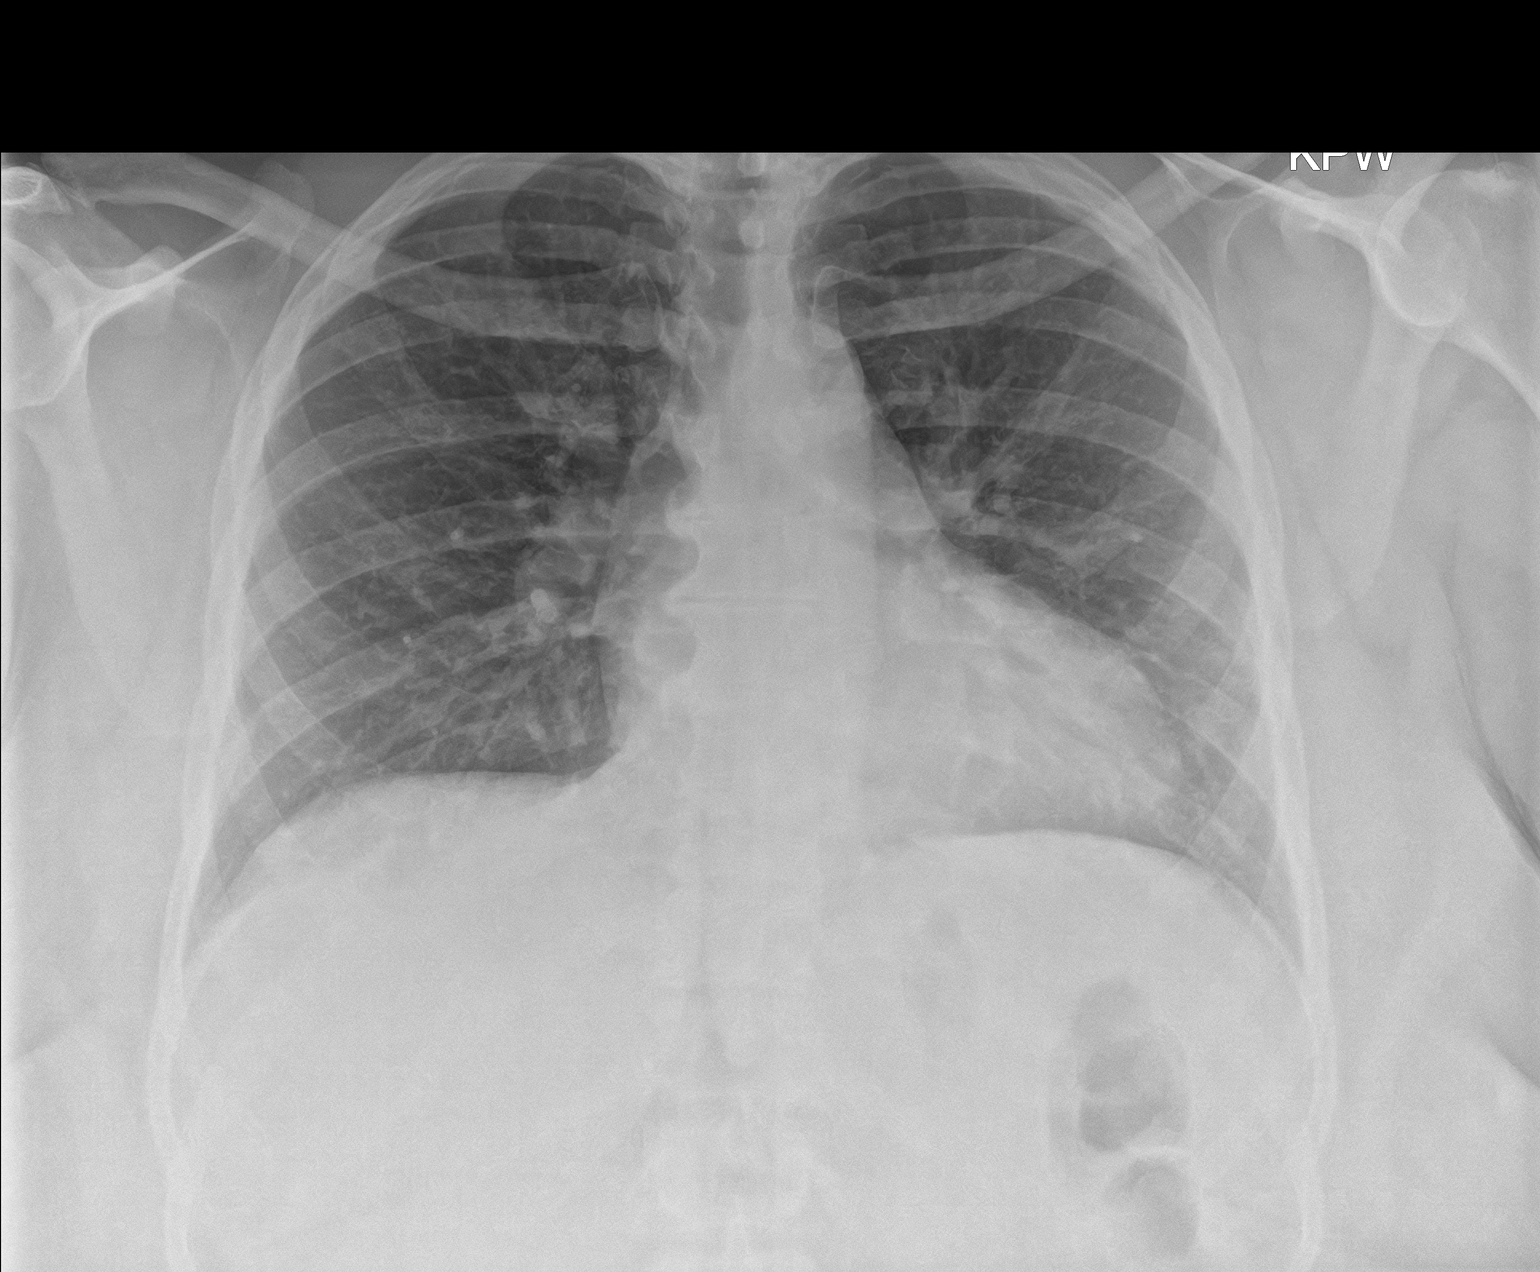
[im 2/2]
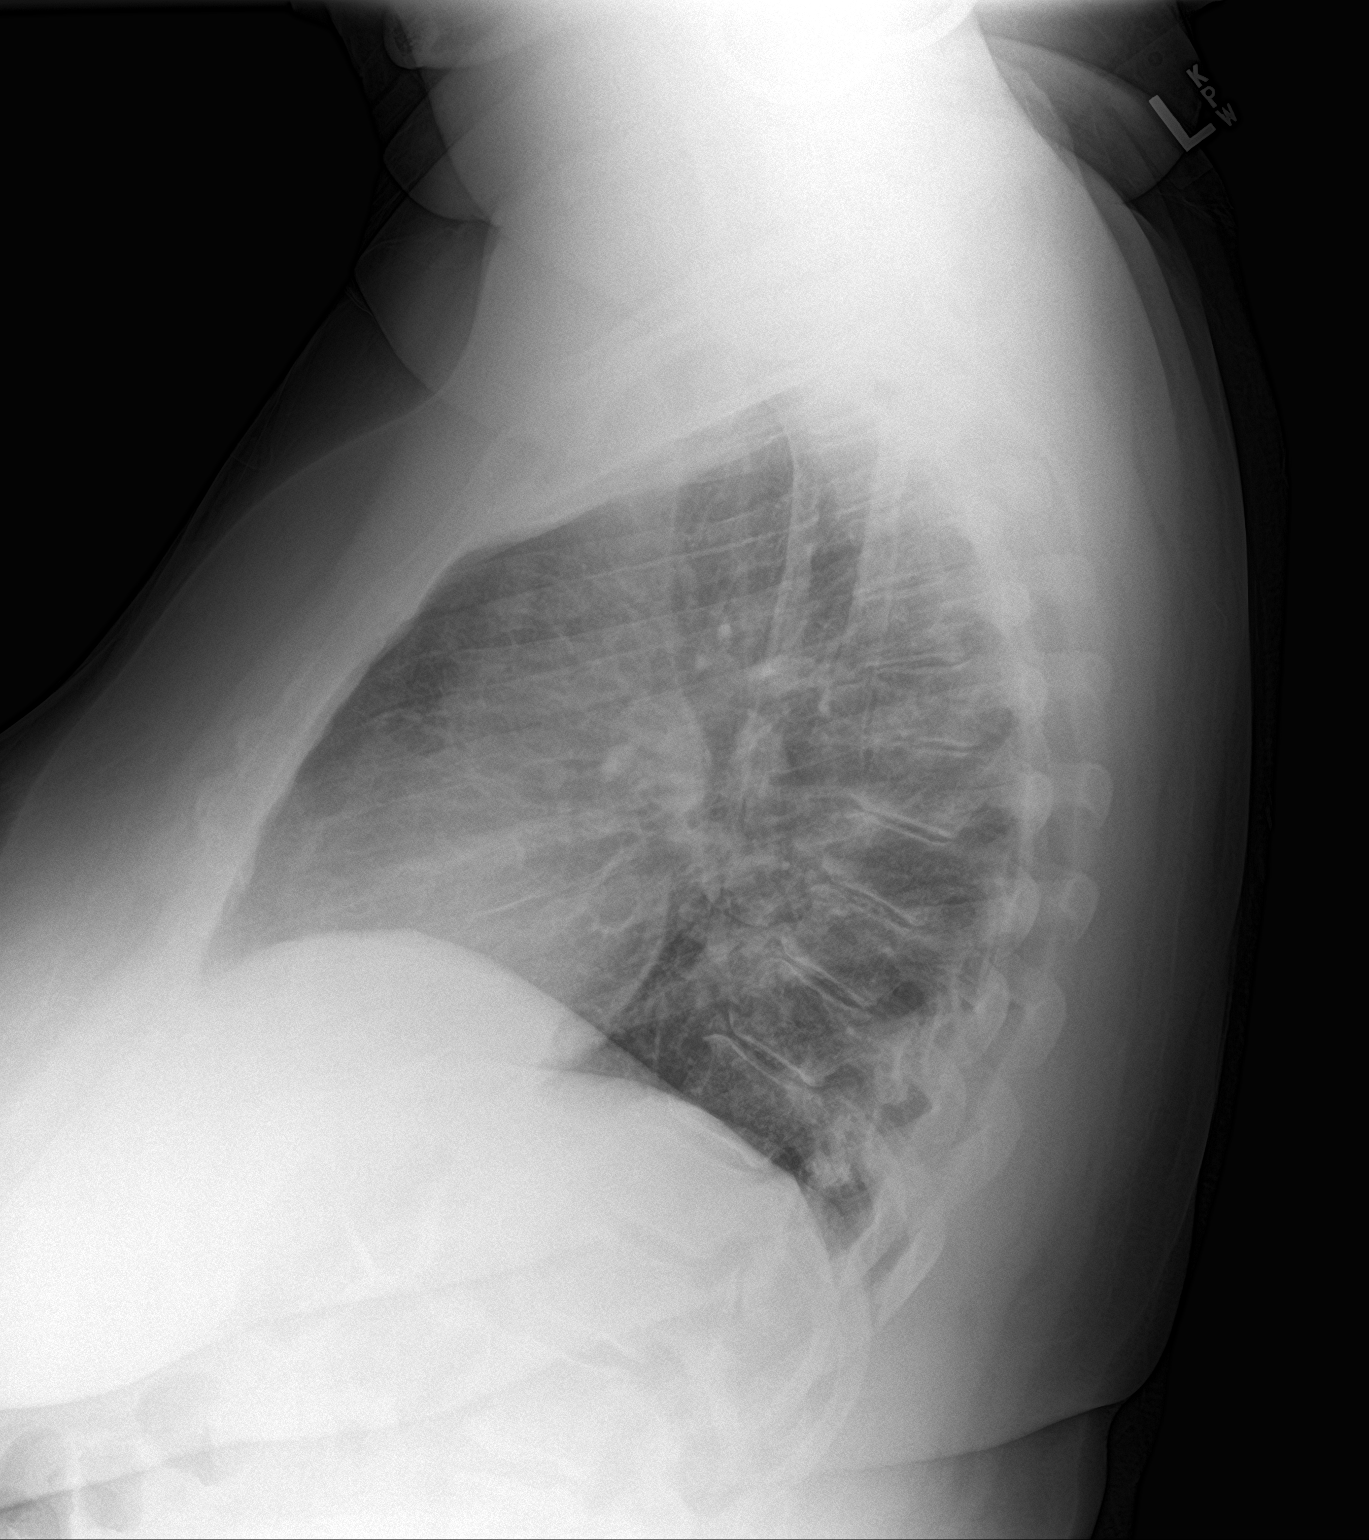

[2 of 2 positions shown; findings below may reference images not displayed]

FINDINGS: The heart size and mediastinal contours are within normal limits.
Both lungs are clear. The visualized skeletal structures are
unremarkable.
IMPRESSION: No active cardiopulmonary disease.

## 2019-07-16 ENCOUNTER — Other Ambulatory Visit: Payer: Self-pay

## 2019-07-16 DIAGNOSIS — U071 COVID-19: Secondary | ICD-10-CM | POA: Diagnosis not present

## 2019-07-16 DIAGNOSIS — K5900 Constipation, unspecified: Secondary | ICD-10-CM | POA: Diagnosis not present

## 2019-07-16 DIAGNOSIS — E785 Hyperlipidemia, unspecified: Secondary | ICD-10-CM | POA: Diagnosis present

## 2019-07-16 DIAGNOSIS — E119 Type 2 diabetes mellitus without complications: Secondary | ICD-10-CM | POA: Diagnosis present

## 2019-07-16 DIAGNOSIS — Z833 Family history of diabetes mellitus: Secondary | ICD-10-CM

## 2019-07-16 DIAGNOSIS — A0839 Other viral enteritis: Secondary | ICD-10-CM | POA: Diagnosis present

## 2019-07-16 DIAGNOSIS — R05 Cough: Secondary | ICD-10-CM | POA: Diagnosis not present

## 2019-07-16 DIAGNOSIS — J1282 Pneumonia due to coronavirus disease 2019: Secondary | ICD-10-CM | POA: Diagnosis present

## 2019-07-16 DIAGNOSIS — I10 Essential (primary) hypertension: Secondary | ICD-10-CM | POA: Diagnosis present

## 2019-07-16 DIAGNOSIS — Z8249 Family history of ischemic heart disease and other diseases of the circulatory system: Secondary | ICD-10-CM

## 2019-07-16 DIAGNOSIS — E876 Hypokalemia: Secondary | ICD-10-CM | POA: Diagnosis not present

## 2019-07-16 DIAGNOSIS — J9601 Acute respiratory failure with hypoxia: Secondary | ICD-10-CM | POA: Diagnosis present

## 2019-07-16 DIAGNOSIS — E559 Vitamin D deficiency, unspecified: Secondary | ICD-10-CM | POA: Diagnosis present

## 2019-07-16 DIAGNOSIS — Z825 Family history of asthma and other chronic lower respiratory diseases: Secondary | ICD-10-CM

## 2019-07-16 DIAGNOSIS — Z806 Family history of leukemia: Secondary | ICD-10-CM

## 2019-07-16 LAB — BASIC METABOLIC PANEL
Anion gap: 9 (ref 5–15)
BUN: 24 mg/dL — ABNORMAL HIGH (ref 6–20)
CO2: 30 mmol/L (ref 22–32)
Calcium: 8.8 mg/dL — ABNORMAL LOW (ref 8.9–10.3)
Chloride: 98 mmol/L (ref 98–111)
Creatinine, Ser: 1.1 mg/dL — ABNORMAL HIGH (ref 0.44–1.00)
GFR calc Af Amer: >60 mL/min (ref >60)
GFR calc non Af Amer: 56 mL/min — ABNORMAL LOW (ref >60)
Glucose, Bld: 135 mg/dL — ABNORMAL HIGH (ref 70–99)
Potassium: 3.7 mmol/L (ref 3.5–5.1)
Sodium: 137 mmol/L (ref 135–145)

## 2019-07-16 LAB — CBC
HCT: 43.3 % (ref 36.0–46.0)
Hemoglobin: 13.9 g/dL (ref 12.0–15.0)
MCH: 26.7 pg (ref 26.0–34.0)
MCHC: 32.1 g/dL (ref 30.0–36.0)
MCV: 83.3 fL (ref 80.0–100.0)
Platelets: 345 10*3/uL (ref 150–400)
RBC: 5.2 MIL/uL — ABNORMAL HIGH (ref 3.87–5.11)
RDW: 14.6 % (ref 11.5–15.5)
WBC: 12.2 10*3/uL — ABNORMAL HIGH (ref 4.0–10.5)
nRBC: 0 % (ref 0.0–0.2)

## 2019-07-16 NOTE — ED Notes (Signed)
Placed on 2L [place]  

## 2019-07-16 NOTE — ED Triage Notes (Signed)
Reports day 11 of COVID, home oxygen meter reading 86-88% today and nausea. States friend told her to come to ER due oxygen being too low. Does not report any increase of SOB other than what she has been experiencing since COVID diagnosis. Alert and oriented X 4, no RR distress noted.

## 2019-07-17 ENCOUNTER — Telehealth: Payer: Self-pay | Admitting: Nurse Practitioner

## 2019-07-17 ENCOUNTER — Inpatient Hospital Stay
Admission: EM | Admit: 2019-07-17 | Discharge: 2019-07-21 | DRG: 177 | Disposition: A | Discharge status: Home / Self Care | Payer: 59 | Attending: Internal Medicine | Admitting: Internal Medicine

## 2019-07-17 ENCOUNTER — Emergency Department: Payer: 59

## 2019-07-17 DIAGNOSIS — I10 Essential (primary) hypertension: Secondary | ICD-10-CM | POA: Diagnosis present

## 2019-07-17 DIAGNOSIS — Z8249 Family history of ischemic heart disease and other diseases of the circulatory system: Secondary | ICD-10-CM | POA: Diagnosis not present

## 2019-07-17 DIAGNOSIS — E119 Type 2 diabetes mellitus without complications: Secondary | ICD-10-CM | POA: Diagnosis present

## 2019-07-17 DIAGNOSIS — R059 Cough, unspecified: Secondary | ICD-10-CM

## 2019-07-17 DIAGNOSIS — Z806 Family history of leukemia: Secondary | ICD-10-CM | POA: Diagnosis not present

## 2019-07-17 DIAGNOSIS — K5900 Constipation, unspecified: Secondary | ICD-10-CM | POA: Diagnosis not present

## 2019-07-17 DIAGNOSIS — Z833 Family history of diabetes mellitus: Secondary | ICD-10-CM | POA: Diagnosis not present

## 2019-07-17 DIAGNOSIS — E559 Vitamin D deficiency, unspecified: Secondary | ICD-10-CM | POA: Diagnosis present

## 2019-07-17 DIAGNOSIS — J9601 Acute respiratory failure with hypoxia: Secondary | ICD-10-CM | POA: Insufficient documentation

## 2019-07-17 DIAGNOSIS — J1282 Pneumonia due to coronavirus disease 2019: Secondary | ICD-10-CM | POA: Diagnosis present

## 2019-07-17 DIAGNOSIS — R05 Cough: Secondary | ICD-10-CM

## 2019-07-17 DIAGNOSIS — E785 Hyperlipidemia, unspecified: Secondary | ICD-10-CM | POA: Diagnosis present

## 2019-07-17 DIAGNOSIS — U071 COVID-19: Secondary | ICD-10-CM | POA: Diagnosis present

## 2019-07-17 DIAGNOSIS — Z825 Family history of asthma and other chronic lower respiratory diseases: Secondary | ICD-10-CM | POA: Diagnosis not present

## 2019-07-17 DIAGNOSIS — A0839 Other viral enteritis: Secondary | ICD-10-CM | POA: Diagnosis present

## 2019-07-17 DIAGNOSIS — E876 Hypokalemia: Secondary | ICD-10-CM | POA: Diagnosis not present

## 2019-07-17 DIAGNOSIS — R7301 Impaired fasting glucose: Secondary | ICD-10-CM | POA: Diagnosis not present

## 2019-07-17 DIAGNOSIS — E1169 Type 2 diabetes mellitus with other specified complication: Secondary | ICD-10-CM | POA: Diagnosis not present

## 2019-07-17 LAB — EXPECTORATED SPUTUM ASSESSMENT W GRAM STAIN, RFLX TO RESP C

## 2019-07-17 LAB — INFLUENZA PANEL BY PCR (TYPE A & B)
Influenza A By PCR: NEGATIVE
Influenza B By PCR: NEGATIVE

## 2019-07-17 LAB — D-DIMER, QUANTITATIVE: D-Dimer, Quant: 0.92 ug/mL-FEU — ABNORMAL HIGH (ref 0.00–0.50)

## 2019-07-17 LAB — HEMOGLOBIN A1C
Hgb A1c MFr Bld: 6.9 % — ABNORMAL HIGH (ref 4.8–5.6)
Mean Plasma Glucose: 151.33 mg/dL

## 2019-07-17 LAB — GLUCOSE, CAPILLARY: Glucose-Capillary: 115 mg/dL — ABNORMAL HIGH (ref 70–99)

## 2019-07-17 LAB — FERRITIN: Ferritin: 221 ng/mL (ref 11–307)

## 2019-07-17 LAB — TROPONIN I (HIGH SENSITIVITY): Troponin I (High Sensitivity): 5 ng/L (ref <18)

## 2019-07-17 LAB — FIBRIN DERIVATIVES D-DIMER (ARMC ONLY): Fibrin derivatives D-dimer (ARMC): 740.76 ng/mL (FEU) — ABNORMAL HIGH (ref 0.00–499.00)

## 2019-07-17 LAB — LACTATE DEHYDROGENASE: LDH: 294 U/L — ABNORMAL HIGH (ref 98–192)

## 2019-07-17 LAB — PROCALCITONIN: Procalcitonin: <0.1 ng/mL

## 2019-07-17 LAB — C-REACTIVE PROTEIN: CRP: 5.7 mg/dL — ABNORMAL HIGH (ref <1.0)

## 2019-07-17 MED ORDER — GUAIFENESIN-DM 100-10 MG/5ML PO SYRP
10.0000 mL | ORAL_SOLUTION | ORAL | Status: DC | PRN
  Filled 2019-07-17: qty 10

## 2019-07-17 MED ORDER — IRBESARTAN 150 MG PO TABS
75.0000 mg | ORAL_TABLET | Freq: Every day | ORAL | Status: DC
  Administered 2019-07-17 – 2019-07-21 (×5): 75 mg via ORAL
  Filled 2019-07-17 (×5): qty 1

## 2019-07-17 MED ORDER — SODIUM CHLORIDE 0.9 % IV SOLN
200.0000 mg | Freq: Once | INTRAVENOUS | Status: AC
  Administered 2019-07-17: 200 mg via INTRAVENOUS
  Filled 2019-07-17: qty 200

## 2019-07-17 MED ORDER — INSULIN ASPART 100 UNIT/ML ~~LOC~~ SOLN: 0.0000 [IU] | Freq: Every day | SUBCUTANEOUS | Status: DC

## 2019-07-17 MED ORDER — ACETAMINOPHEN 325 MG PO TABS: 650.0000 mg | ORAL_TABLET | Freq: Four times a day (QID) | ORAL | Status: DC | PRN

## 2019-07-17 MED ORDER — SODIUM CHLORIDE 0.9 % IV BOLUS
1000.0000 mL | Freq: Once | INTRAVENOUS | Status: AC
  Administered 2019-07-17: 04:00:00 1000 mL via INTRAVENOUS

## 2019-07-17 MED ORDER — ENOXAPARIN SODIUM 40 MG/0.4ML ~~LOC~~ SOLN
40.0000 mg | Freq: Two times a day (BID) | SUBCUTANEOUS | Status: DC
  Administered 2019-07-17 – 2019-07-21 (×9): 40 mg via SUBCUTANEOUS
  Filled 2019-07-17 (×9): qty 0.4

## 2019-07-17 MED ORDER — SODIUM CHLORIDE 0.9 % IV SOLN: INTRAVENOUS | Status: DC

## 2019-07-17 MED ORDER — DEXAMETHASONE SODIUM PHOSPHATE 4 MG/ML IJ SOLN
6.0000 mg | INTRAMUSCULAR | Status: DC
  Administered 2019-07-17 – 2019-07-20 (×4): 6 mg via INTRAVENOUS
  Filled 2019-07-17 (×3): qty 2
  Filled 2019-07-17: qty 1.5
  Filled 2019-07-17: qty 2

## 2019-07-17 MED ORDER — ONDANSETRON HCL 4 MG PO TABS: 4.0000 mg | ORAL_TABLET | Freq: Four times a day (QID) | ORAL | Status: DC | PRN

## 2019-07-17 MED ORDER — TRAZODONE HCL 50 MG PO TABS: 25.0000 mg | ORAL_TABLET | Freq: Every evening | ORAL | Status: DC | PRN

## 2019-07-17 MED ORDER — INSULIN ASPART 100 UNIT/ML ~~LOC~~ SOLN
0.0000 [IU] | Freq: Three times a day (TID) | SUBCUTANEOUS | Status: DC
  Administered 2019-07-18: 09:00:00 1 [IU] via SUBCUTANEOUS
  Administered 2019-07-19: 2 [IU] via SUBCUTANEOUS
  Administered 2019-07-19: 12:00:00 1 [IU] via SUBCUTANEOUS
  Administered 2019-07-20: 2 [IU] via SUBCUTANEOUS
  Administered 2019-07-20: 3 [IU] via SUBCUTANEOUS
  Filled 2019-07-17 (×3): qty 1

## 2019-07-17 MED ORDER — ONDANSETRON HCL 4 MG/2ML IJ SOLN
4.0000 mg | Freq: Four times a day (QID) | INTRAMUSCULAR | Status: DC | PRN
  Administered 2019-07-17 – 2019-07-18 (×2): 4 mg via INTRAVENOUS
  Filled 2019-07-17 (×2): qty 2

## 2019-07-17 MED ORDER — SODIUM CHLORIDE 0.9 % IV SOLN: 200.0000 mg | Freq: Once | INTRAVENOUS | Status: DC

## 2019-07-17 MED ORDER — HYDROCOD POLST-CPM POLST ER 10-8 MG/5ML PO SUER: 5.0000 mL | Freq: Two times a day (BID) | ORAL | Status: DC | PRN

## 2019-07-17 MED ORDER — FAMOTIDINE 20 MG PO TABS
20.0000 mg | ORAL_TABLET | Freq: Two times a day (BID) | ORAL | Status: DC
  Administered 2019-07-17 – 2019-07-21 (×9): 20 mg via ORAL
  Filled 2019-07-17 (×9): qty 1

## 2019-07-17 MED ORDER — ZINC SULFATE 220 (50 ZN) MG PO CAPS
220.0000 mg | ORAL_CAPSULE | Freq: Every day | ORAL | Status: DC
  Administered 2019-07-17 – 2019-07-21 (×5): 220 mg via ORAL
  Filled 2019-07-17 (×5): qty 1

## 2019-07-17 MED ORDER — VITAMIN D3 25 MCG (1000 UNIT) PO TABS
1000.0000 [IU] | ORAL_TABLET | Freq: Every day | ORAL | Status: DC
  Administered 2019-07-17 – 2019-07-21 (×5): 1000 [IU] via ORAL
  Filled 2019-07-17 (×10): qty 1

## 2019-07-17 MED ORDER — MAGNESIUM HYDROXIDE 400 MG/5ML PO SUSP
30.0000 mL | Freq: Every day | ORAL | Status: DC | PRN
  Filled 2019-07-17: qty 30

## 2019-07-17 MED ORDER — DEXAMETHASONE SODIUM PHOSPHATE 10 MG/ML IJ SOLN
6.0000 mg | Freq: Once | INTRAMUSCULAR | Status: AC
  Administered 2019-07-17: 04:00:00 6 mg via INTRAVENOUS

## 2019-07-17 MED ORDER — ONDANSETRON HCL 4 MG/2ML IJ SOLN
4.0000 mg | Freq: Once | INTRAMUSCULAR | Status: AC
  Administered 2019-07-17: 05:00:00 4 mg via INTRAVENOUS

## 2019-07-17 MED ORDER — HYDROCHLOROTHIAZIDE 12.5 MG PO CAPS
12.5000 mg | ORAL_CAPSULE | Freq: Every day | ORAL | Status: DC
  Administered 2019-07-17 – 2019-07-21 (×5): 12.5 mg via ORAL
  Filled 2019-07-17 (×5): qty 1

## 2019-07-17 MED ORDER — SODIUM CHLORIDE 0.9 % IV SOLN
2.0000 g | INTRAVENOUS | Status: DC
  Administered 2019-07-17 – 2019-07-21 (×5): 2 g via INTRAVENOUS
  Filled 2019-07-17: qty 2
  Filled 2019-07-17: qty 20
  Filled 2019-07-17 (×3): qty 2

## 2019-07-17 MED ORDER — AZITHROMYCIN 500 MG PO TABS
250.0000 mg | ORAL_TABLET | Freq: Every day | ORAL | Status: AC
  Administered 2019-07-18 – 2019-07-21 (×4): 250 mg via ORAL
  Filled 2019-07-17 (×4): qty 1

## 2019-07-17 MED ORDER — ASPIRIN EC 81 MG PO TBEC
81.0000 mg | DELAYED_RELEASE_TABLET | Freq: Every day | ORAL | Status: DC
  Administered 2019-07-17 – 2019-07-21 (×5): 81 mg via ORAL
  Filled 2019-07-17 (×5): qty 1

## 2019-07-17 MED ORDER — ASCORBIC ACID 500 MG PO TABS
500.0000 mg | ORAL_TABLET | Freq: Every day | ORAL | Status: DC
  Administered 2019-07-17 – 2019-07-21 (×5): 500 mg via ORAL
  Filled 2019-07-17 (×5): qty 1

## 2019-07-17 MED ORDER — GUAIFENESIN ER 600 MG PO TB12
600.0000 mg | ORAL_TABLET | Freq: Two times a day (BID) | ORAL | Status: DC
  Administered 2019-07-17 – 2019-07-21 (×9): 600 mg via ORAL
  Filled 2019-07-17 (×10): qty 1

## 2019-07-17 MED ORDER — SODIUM CHLORIDE 0.9 % IV SOLN: 100.0000 mg | Freq: Every day | INTRAVENOUS | Status: DC

## 2019-07-17 MED ORDER — AZITHROMYCIN 500 MG PO TABS
500.0000 mg | ORAL_TABLET | Freq: Every day | ORAL | Status: AC
  Administered 2019-07-17: 500 mg via ORAL
  Filled 2019-07-17: qty 1

## 2019-07-17 MED ORDER — VALSARTAN-HYDROCHLOROTHIAZIDE 80-12.5 MG PO TABS: 1.0000 | ORAL_TABLET | Freq: Every day | ORAL | Status: DC

## 2019-07-17 MED ORDER — SODIUM CHLORIDE 0.9 % IV SOLN
100.0000 mg | Freq: Every day | INTRAVENOUS | Status: AC
  Administered 2019-07-18 – 2019-07-21 (×4): 100 mg via INTRAVENOUS
  Filled 2019-07-17 (×5): qty 20

## 2019-07-17 NOTE — ED Notes (Signed)
X-ray at bedside; COVID precaution sign applied to door

## 2019-07-17 NOTE — Progress Notes (Signed)
Patient ID: Elizabeth Barrett, female   DOB: [birth year], 54 y.o.   MRN: [id number] [facility] PROGRESS NOTE  DAGEN ZELNICK [id number] DOB: 1966/05/28 DOA: 07/17/2019 PCP: Adin Hector, MD  HPI/Subjective: Patient feeling a little bit better.  Still with some cough and some shortness of breath.  Objective: Vitals:   07/17/19 1000 07/17/19 1509  BP: (!) 113/55 126/63  Pulse: 79 81  Resp: (!) 22 (!) 23  Temp:    SpO2: 99% 93%    Intake/Output Summary (Last 24 hours) at 07/17/2019 1635 Last data filed at 07/17/2019 0541 Gross per 24 hour  Intake 1290 ml  Output --  Net 1290 ml   Filed Weights   07/16/19 2326  Weight: 114.8 kg    ROS: Review of Systems  Constitutional: Negative for chills and fever.  Eyes: Negative for blurred vision.  Respiratory: Positive for cough and shortness of breath.   Cardiovascular: Negative for chest pain.  Gastrointestinal: Negative for abdominal pain, constipation, diarrhea, nausea and vomiting.  Genitourinary: Negative for dysuria.  Musculoskeletal: Negative for joint pain.  Neurological: Negative for dizziness and headaches.   Exam: Physical Exam  Constitutional: She is oriented to person, place, and time.  HENT:  Nose: No mucosal edema.  Mouth/Throat: No oropharyngeal exudate or posterior oropharyngeal edema.  Eyes: Pupils are equal, round, and reactive to light. Conjunctivae, EOM and lids are normal.  Neck: No JVD present. Carotid bruit is not present. No thyroid mass and no thyromegaly present.  Cardiovascular: S1 normal and S2 normal. Exam reveals no gallop.  No murmur heard. Respiratory: No respiratory distress. She has decreased breath sounds in the right lower field and the left lower field. She has no wheezes. She has no rhonchi. She has no rales.  GI: Soft. Bowel sounds are normal. There is no abdominal tenderness.  Musculoskeletal:     Cervical back: No edema.     Right ankle: No swelling.     Left ankle: No swelling.   Lymphadenopathy:    She has no cervical adenopathy.  Neurological: She is alert and oriented to person, place, and time. No cranial nerve deficit.  Skin: Skin is warm. No rash noted. Nails show no clubbing.  Psychiatric: She has a normal mood and affect.      Data Reviewed: Basic Metabolic Panel: Recent Labs  Lab 07/16/19 2329  NA 137  K 3.7  CL 98  CO2 30  GLUCOSE 135*  BUN 24*  CREATININE 1.10*  CALCIUM 8.8*   CBC: Recent Labs  Lab 07/16/19 2329  WBC 12.2*  HGB 13.9  HCT 43.3  MCV 83.3  PLT 345     Recent Results (from the past 240 hour(s))  Culture, sputum-assessment     Status: None   Collection Time: 07/17/19  7:16 AM   Specimen: Expectorated Sputum  Result Value Ref Range Status   Specimen Description EXPECTORATED SPUTUM  Final   Special Requests NONE  Final   Sputum evaluation   Final    THIS SPECIMEN IS ACCEPTABLE FOR SPUTUM CULTURE Performed at [facility], [address] [place], [place] [address]    Report Status 07/17/2019 FINAL  Final     Studies: DG Chest 1 View  Result Date: 07/17/2019 CLINICAL DATA:  Day 11, COVID-19, low oxygen saturation EXAM: CHEST  1 VIEW COMPARISON:  Radiograph 04/26/2018 FINDINGS: Widespread multifocal interstitial and airspace opacities throughout both lungs with a basilar and peripheral predominance. Cardiomediastinal contours are similar to prior accounting  for differences in technique. No pneumothorax or effusion. No acute osseous or soft tissue abnormality. IMPRESSION: Widespread interstitial and airspace opacities throughout both lungs compatible with multifocal pneumonia in the setting of COVID-19. Electronically Signed   By: Lovena Le M.D.   On: 07/17/2019 03:43    Scheduled Meds: . vitamin C  500 mg Oral Daily  . aspirin EC  81 mg Oral Daily  . [START ON 07/18/2019] azithromycin  250 mg Oral Daily  . cholecalciferol  1,000 Units Oral Daily  . dexamethasone (DECADRON) injection  6 mg Intravenous  Q24H  . enoxaparin (LOVENOX) injection  40 mg Subcutaneous Q12H  . famotidine  20 mg Oral BID  . guaiFENesin  600 mg Oral BID  . irbesartan  75 mg Oral Daily   And  . hydrochlorothiazide  12.5 mg Oral Daily  . zinc sulfate  220 mg Oral Daily   Continuous Infusions: . cefTRIAXone (ROCEPHIN)  IV Stopped (07/17/19 1312)  . [START ON 07/18/2019] remdesivir 100 mg in NS 100 mL      Assessment/Plan:  1. Covid-19 pneumonia.  Patient received first dose of remdesivir today.  Patient on Rocephin and Zithromax.  Continue Decadron.  Giving zinc and vitamin C. 2. Acute hypoxic respiratory failure.  I tapered the patient's oxygen down to the 3 L this morning and patient continued to taper down to 2 L. 3. Essential hypertension on irbesartan and hydrochlorothiazide 4. Impaired fasting glucose.  Check a hemoglobin A1c.  Put on sliding scale while on IV steroids.  Code Status:     Code Status Orders  (From admission, onward)         Start     Ordered   07/17/19 0423  Full code  Continuous     07/17/19 0430        Code Status History    This patient has a current code status but no historical code status.   Advance Care Planning Activity     Family Communication: Tried to call her husband but as per the patient he was admitted too Disposition Plan: To be determined  Antibiotics:  Rocephin  Zithromax  Remdesivir  Time spent: 35 minutes  [place]

## 2019-07-17 NOTE — Progress Notes (Signed)
Remdesivir - Pharmacy Brief Note   O:  ALT: N/A CXR: Widespread interstitial and airspace opacities throughout both lungs compatible with multifocal pneumonia in the setting of COVID-19 SpO2: 94% on    A/P:  SARS-COV-2 (+) 07/09/19  Remdesivir 200 mg IVPB once followed by 100 mg IVPB daily x 4 days.   Pernell Dupre, PharmD, BCPS Clinical Pharmacist 07/17/2019 12:33 PM

## 2019-07-17 NOTE — ED Notes (Signed)
Pt found in room att; pt is COVID positive, oxygen saturation at home <88% without exertion, pt denies SOB, appears relaxed in bed,   Dr Alfred Levins trial pt standing O2 sats at 86% on RA and 90% at 2 lpm [place], pt now 96% at 5 lpm [place]

## 2019-07-17 NOTE — H&P (Signed)
[place] [facility] [place] NAME: Elizabeth Barrett    MR#:  [id number]  DATE OF BIRTH:  Oct 26, 1965  DATE OF ADMISSION:  07/17/2019  PRIMARY CARE PHYSICIAN: Adin Hector, MD   REQUESTING/REFERRING PHYSICIAN: Gonzella Lex, MD  CHIEF COMPLAINT:   Chief Complaint  Patient presents with  . Other    COVID  . Nausea  Generalized weakness  HISTORY OF PRESENT ILLNESS:  Elizabeth Barrett  is a 54 y.o. African-American female with a known history of type 2 diabetes mellitus, hypertension and dyslipidemia, who presented to the emergency room with acute onset of generalized weakness over the last couple of days.  She was diagnosed 11 days ago with COVID-19 and her husband tested positive as well around the same time after Christmas.  She started having significant body aches as well as dry cough, nausea, vomiting and diarrhea as well as a headache with eye pressure.  She did not have dyspnea or wheezing.  She had chills over the last couple of days and a fever of 101.8.  She felt some abdominal pain when she had nausea.  She denied any chest pain or palpitations.  No dysuria, oliguria or hematuria or flank pain.  Before she came to the emergency room, she noted her pulse oximetry has dropped to 88% on room air.  In the ER standing pulse oximetry was 80% on room air.  She was placed on 3 L of O2 by nasal cannula pulse ox was up to 87%.  This was increased to 5 L/min and pulse oximetry was up to 95%.  Upon presentation to the emergency room, blood pressure was 142/73 with a respiratory to 22 and temperature was 98.1.  BMP was remarkable for a BUN of 24 and CBC showed mild leukocytosis 12.2.  EKG showed normal sinus rhythm with rate of 95 with moderate voltage criteria for LVH and poor R wave progression.  Portable chest x-ray showed widespread interstitial and airspace opacities throughout both lungs consistent with multifocal pneumonia in the setting of COVID-19.  The patient was  given 6 mg of IV Decadron and IV remdesivir as well as 1 L of IV normal saline.  She will be admitted to medical monitored bed for further evaluation and management. PAST MEDICAL HISTORY:   Past Medical History:  Diagnosis Date  . Chest pain   . Diabetes mellitus without complication ([place])   . Hyperlipidemia   . Hypertension   . Malaise   . Microscopic hematuria   . Obesity   . Vitamin D deficiency     PAST SURGICAL HISTORY:   Past Surgical History:  Procedure Laterality Date  . ABDOMINAL HYSTERECTOMY    . BREAST BIOPSY Left 6-7 YRS AGO   NEG  . COLONOSCOPY WITH PROPOFOL N/A 07/25/2016   Procedure: COLONOSCOPY WITH PROPOFOL;  Surgeon: Manya Silvas, MD;  Location: [facility] ENDOSCOPY;  Service: Endoscopy;  Laterality: N/A;  . FRACTURE SURGERY Right 2002   elbow     SOCIAL HISTORY:   Social History   Tobacco Use  . Smoking status: Never Smoker  . Smokeless tobacco: Never Used  Substance Use Topics  . Alcohol use: Yes    Alcohol/week: 0.0 standard drinks    Comment: occasionally drinks wine    FAMILY HISTORY:   Family History  Problem Relation Age of Onset  . Hypertension Mother   . Cancer Father        luekemia  . Heart disease Father   .  Diabetes Father   . Asthma Son   . Diabetes Son   . Breast cancer Neg Hx     DRUG ALLERGIES:  No Known Allergies  REVIEW OF SYSTEMS:   ROS As per history of present illness. All pertinent systems were reviewed above. Constitutional,  HEENT, cardiovascular, respiratory, GI, GU, musculoskeletal, neuro, psychiatric, endocrine,  integumentary and hematologic systems were reviewed and are otherwise  negative/unremarkable except for positive findings mentioned above in the HPI.   MEDICATIONS AT HOME:   Prior to Admission medications   Medication Sig Start Date End Date Taking? Authorizing Provider  azithromycin (ZITHROMAX) 250 MG tablet TK 2 TS PO ON DAY 1, THEN TK 1 T PO D FOR 4 DAYS 07/11/19  Yes [provider]  predniSONE (DELTASONE) 10 MG tablet Take 10 mg by mouth as directed.  07/11/19  Yes [provider]  valsartan-hydrochlorothiazide (DIOVAN-HCT) 80-12.5 MG tablet Take 1 tablet by mouth daily. 03/19/19  Yes [provider]      VITAL SIGNS:  Blood pressure 131/79, pulse 96, temperature 98.1 F (36.7 C), temperature source Oral, resp. rate 20, height 5\' 6"  (1.676 m), weight 114.8 kg, SpO2 95 %.  PHYSICAL EXAMINATION:  Physical Exam  GENERAL:  54 y.o.-year-old American female patient lying in the bed with mild respiratory distress with conversational dyspnea. EYES: Pupils equal, round, reactive to light and accommodation. No scleral icterus. Extraocular muscles intact.  HEENT: Head atraumatic, normocephalic. Oropharynx and nasopharynx clear.  NECK:  Supple, no jugular venous distention. No thyroid enlargement, no tenderness.  LUNGS: Diminished bibasilar breath sounds with bibasal crackles. CARDIOVASCULAR: Regular rate and rhythm, S1, S2 normal. No murmurs, rubs, or gallops.  ABDOMEN: Soft, nondistended, nontender. Bowel sounds present. No organomegaly or mass.  EXTREMITIES: No pedal edema, cyanosis, or clubbing.  NEUROLOGIC: Cranial nerves II through XII are intact. Muscle strength 5/5 in all extremities. Sensation intact. Gait not checked.  PSYCHIATRIC: The patient is alert and oriented x 3.  Normal affect and good eye contact. SKIN: No obvious rash, lesion, or ulcer.   LABORATORY PANEL:   CBC Recent Labs  Lab 07/16/19 2329  WBC 12.2*  HGB 13.9  HCT 43.3  PLT 345   ------------------------------------------------------------------------------------------------------------------  Chemistries  Recent Labs  Lab 07/16/19 2329  NA 137  K 3.7  CL 98  CO2 30  GLUCOSE 135*  BUN 24*  CREATININE 1.10*  CALCIUM 8.8*   ------------------------------------------------------------------------------------------------------------------  Cardiac Enzymes No  results for input(s): TROPONINI in the last 168 hours. ------------------------------------------------------------------------------------------------------------------  RADIOLOGY:  DG Chest 1 View  Result Date: 07/17/2019 CLINICAL DATA:  Day 11, COVID-19, low oxygen saturation EXAM: CHEST  1 VIEW COMPARISON:  Radiograph 04/26/2018 FINDINGS: Widespread multifocal interstitial and airspace opacities throughout both lungs with a basilar and peripheral predominance. Cardiomediastinal contours are similar to prior accounting for differences in technique. No pneumothorax or effusion. No acute osseous or soft tissue abnormality. IMPRESSION: Widespread interstitial and airspace opacities throughout both lungs compatible with multifocal pneumonia in the setting of COVID-19. Electronically Signed   By: Lovena Le M.D.   On: 07/17/2019 03:43      IMPRESSION AND PLAN:   1.  Acute hypoxemic respiratory failure secondary to COVID-19. -The patient will be admitted to a medically monitored isolation bed. -O2 protocol will be followed to keep O2 saturation above 93.   2.  Multifocal pneumonia secondary to COVID-19. -The patient will be admitted to an isolation monitored bed with droplet and contact precautions. -The patient will be placed  on scheduled Mucinex and as needed Tussionex. -We will avoid nebulization as much as we can, give bronchodilator MDI if needed, and with deterioration of oxygenation try to avoid BiPAP/CPAP if possible.   . -We will follow CRP, ferritin, LDH and D-dimer. -Will follow manual differential for ANC/ALC ratio as well as follow troponin I and daily CBC with manual differential and CMP. - Will place the patient on IV Remdisivir and IV steroid therapy with Decadron with elevated inflammatory markers. - I discussed convalescent plasma benefits and risks with the patient who was agreeable to proceed with it. -The patient will be placed on vitamin D3, vitamin C, zinc sulfate, p.o.  Pepcid and aspirin. -Should her CRP come back above 7, she should be a candidate for Actemra.  3.  Hypertension. -We will continue Diovan HCT.  4.  Type 2 diabetes mellitus. -The patient will be placed on supplement coverage with NovoLog. -We will hold Metformin off.  5.  DVT prophylaxis. -Subtenons Lovenox.    All the records are reviewed and case discussed with ED provider. The plan of care was discussed in details with the patient (and family). I answered all questions. The patient agreed to proceed with the above mentioned plan. Further management will depend upon hospital course.   CODE STATUS: Full code  TOTAL TIME TAKING CARE OF THIS PATIENT: 55 minutes.    Christel Mormon M.D on 07/17/2019 at 4:37 AM  [facility]   From 7 PM-7 AM, contact night-coverage [email or web address]  CC: Primary care physician; Adin Hector, MD   Note: This dictation was prepared with Dragon dictation along with smaller phrase technology. Any transcriptional errors that result from this process are unintentional.

## 2019-07-17 NOTE — Progress Notes (Signed)
Remdesivir - Pharmacy Brief Note   O:  ALT: pending CXR:  SpO2: 94% on [place]   A/P:  Remdesivir 200 mg IVPB once followed by 100 mg IVPB daily x 4 days.   Hart Robinsons, PharmD Clinical Pharmacist   07/17/2019 3:39 AM
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
## 2019-07-17 NOTE — Telephone Encounter (Signed)
Called to Discuss with patient about Covid symptoms and the use of bamlanivimab, a monoclonal antibody infusion for those with mild to moderate Covid symptoms and at a high risk of hospitalization.     Patient is currently in the ED - symptoms started more than 10 days ago.

## 2019-07-17 NOTE — Progress Notes (Signed)
PHARMACIST - PHYSICIAN COMMUNICATION  CONCERNING:  Enoxaparin (Lovenox) for DVT Prophylaxis   RECOMMENDATION: Patient was prescribed enoxaprin 40mg  q24 hours for VTE prophylaxis.   Filed Weights   07/16/19 2326  Weight: 253 lb (114.8 kg)   Body mass index is 40.84 kg/m.  Estimated Creatinine Clearance: 74.3 mL/min (A) (by C-G formula based on SCr of 1.1 mg/dL (H)).  Based on [place] patient is candidate for enoxaparin 40mg  every 12 hour dosing due to BMI being >40.  DESCRIPTION: Pharmacy has adjusted enoxaparin dose per [facility] policy.  Patient is now receiving enoxaparin 40mg  every 12 hours.   Ena Dawley, PharmD Clinical Pharmacist  07/17/2019 5:22 AM

## 2019-07-17 NOTE — Progress Notes (Signed)
Pt designated her mother Tawnya Crook as her designated caller. I called Mrs. Justin Mend and updated her on pt status and her roll as the designated caller. She stated that she understood and is appreciative of the information and the care we are giving. All questions answered at this time.

## 2019-07-18 LAB — CBC WITH DIFFERENTIAL/PLATELET
Abs Immature Granulocytes: 0.06 10*3/uL (ref 0.00–0.07)
Basophils Absolute: 0 10*3/uL (ref 0.0–0.1)
Basophils Relative: 0 %
Eosinophils Absolute: 0 10*3/uL (ref 0.0–0.5)
Eosinophils Relative: 0 %
HCT: 40.5 % (ref 36.0–46.0)
Hemoglobin: 12.8 g/dL (ref 12.0–15.0)
Immature Granulocytes: 1 %
Lymphocytes Relative: 18 %
Lymphs Abs: 2.1 10*3/uL (ref 0.7–4.0)
MCH: 26.8 pg (ref 26.0–34.0)
MCHC: 31.6 g/dL (ref 30.0–36.0)
MCV: 84.7 fL (ref 80.0–100.0)
Monocytes Absolute: 1.1 10*3/uL — ABNORMAL HIGH (ref 0.1–1.0)
Monocytes Relative: 10 %
Neutro Abs: 8.2 10*3/uL — ABNORMAL HIGH (ref 1.7–7.7)
Neutrophils Relative %: 71 %
Platelets: 361 10*3/uL (ref 150–400)
RBC: 4.78 MIL/uL (ref 3.87–5.11)
RDW: 14.6 % (ref 11.5–15.5)
Smear Review: NORMAL
WBC: 11.4 10*3/uL — ABNORMAL HIGH (ref 4.0–10.5)
nRBC: 0 % (ref 0.0–0.2)

## 2019-07-18 LAB — COMPREHENSIVE METABOLIC PANEL
ALT: 27 U/L (ref 0–44)
AST: 26 U/L (ref 15–41)
Albumin: 3.3 g/dL — ABNORMAL LOW (ref 3.5–5.0)
Alkaline Phosphatase: 72 U/L (ref 38–126)
Anion gap: 11 (ref 5–15)
BUN: 22 mg/dL — ABNORMAL HIGH (ref 6–20)
CO2: 26 mmol/L (ref 22–32)
Calcium: 8.7 mg/dL — ABNORMAL LOW (ref 8.9–10.3)
Chloride: 103 mmol/L (ref 98–111)
Creatinine, Ser: 0.88 mg/dL (ref 0.44–1.00)
GFR calc Af Amer: >60 mL/min (ref >60)
GFR calc non Af Amer: >60 mL/min (ref >60)
Glucose, Bld: 133 mg/dL — ABNORMAL HIGH (ref 70–99)
Potassium: 4 mmol/L (ref 3.5–5.1)
Sodium: 140 mmol/L (ref 135–145)
Total Bilirubin: 0.6 mg/dL (ref 0.3–1.2)
Total Protein: 7.6 g/dL (ref 6.5–8.1)

## 2019-07-18 LAB — GLUCOSE, CAPILLARY
Glucose-Capillary: 135 mg/dL — ABNORMAL HIGH (ref 70–99)
Glucose-Capillary: 192 mg/dL — ABNORMAL HIGH (ref 70–99)
Glucose-Capillary: 123 mg/dL — ABNORMAL HIGH (ref 70–99)

## 2019-07-18 LAB — ABO/RH: ABO/RH(D): B POS

## 2019-07-18 LAB — FERRITIN: Ferritin: 267 ng/mL (ref 11–307)

## 2019-07-18 LAB — FIBRIN DERIVATIVES D-DIMER (ARMC ONLY): Fibrin derivatives D-dimer (ARMC): 827.82 ng/mL (FEU) — ABNORMAL HIGH (ref 0.00–499.00)

## 2019-07-18 LAB — C-REACTIVE PROTEIN: CRP: 5 mg/dL — ABNORMAL HIGH (ref <1.0)

## 2019-07-18 NOTE — Progress Notes (Signed)
Patient ID: Elizabeth Barrett, female   DOB: 1966-03-27, 54 y.o.   MRN: [id number] [facility] PROGRESS NOTE  OCEA BROTMAN [id number] DOB: 1966-03-20 DOA: 07/17/2019 PCP: Adin Hector, MD  HPI/Subjective: Patient feeling better today than yesterday.  Has some cough and shortness of breath still been able to get a better and deeper breath.  Appetite picking up.  Objective: Vitals:   07/18/19 0757 07/18/19 0800  BP: 112/82   Pulse: 74   Resp: 19   Temp: 98.5 F (36.9 C)   SpO2: (!) 88% 90%    Intake/Output Summary (Last 24 hours) at 07/18/2019 1335 Last data filed at 07/17/2019 1800 Gross per 24 hour  Intake 100 ml  Output --  Net 100 ml   Filed Weights   07/16/19 2326  Weight: 114.8 kg    ROS: Review of Systems  Constitutional: Negative for chills and fever.  Eyes: Negative for blurred vision.  Respiratory: Positive for cough and shortness of breath.   Cardiovascular: Negative for chest pain.  Gastrointestinal: Negative for abdominal pain, constipation, diarrhea, nausea and vomiting.  Genitourinary: Negative for dysuria.  Musculoskeletal: Negative for joint pain.  Neurological: Negative for dizziness and headaches.   Exam: Physical Exam  Constitutional: She is oriented to person, place, and time.  HENT:  Nose: No mucosal edema.  Mouth/Throat: No oropharyngeal exudate or posterior oropharyngeal edema.  Eyes: Pupils are equal, round, and reactive to light. Conjunctivae, EOM and lids are normal.  Neck: No JVD present. Carotid bruit is not present. No thyroid mass and no thyromegaly present.  Cardiovascular: S1 normal and S2 normal. Exam reveals no gallop.  No murmur heard. Respiratory: No respiratory distress. She has decreased breath sounds in the right lower field and the left lower field. She has no wheezes. She has no rhonchi. She has no rales.  GI: Soft. Bowel sounds are normal. There is no abdominal tenderness.  Musculoskeletal:     Cervical back: No  edema.     Right ankle: No swelling.     Left ankle: No swelling.  Lymphadenopathy:    She has no cervical adenopathy.  Neurological: She is alert and oriented to person, place, and time. No cranial nerve deficit.  Skin: Skin is warm. No rash noted. Nails show no clubbing.  Psychiatric: She has a normal mood and affect.      Data Reviewed: Basic Metabolic Panel: Recent Labs  Lab 07/16/19 2329 07/18/19 0329  NA 137 140  K 3.7 4.0  CL 98 103  CO2 30 26  GLUCOSE 135* 133*  BUN 24* 22*  CREATININE 1.10* 0.88  CALCIUM 8.8* 8.7*   CBC: Recent Labs  Lab 07/16/19 2329 07/18/19 0329  WBC 12.2* 11.4*  NEUTROABS  --  8.2*  HGB 13.9 12.8  HCT 43.3 40.5  MCV 83.3 84.7  PLT 345 361     Recent Results (from the past 240 hour(s))  Culture, sputum-assessment     Status: None   Collection Time: 07/17/19  7:16 AM   Specimen: Expectorated Sputum  Result Value Ref Range Status   Specimen Description EXPECTORATED SPUTUM  Final   Special Requests NONE  Final   Sputum evaluation   Final    THIS SPECIMEN IS ACCEPTABLE FOR SPUTUM CULTURE Performed at [facility], [address] [place], [place] [address]    Report Status 07/17/2019 FINAL  Final  Culture, blood (Routine X 2) w Reflex to ID Panel     Status: None (Preliminary  result)   Collection Time: 07/17/19  7:16 AM   Specimen: BLOOD  Result Value Ref Range Status   Specimen Description BLOOD LEFT AC  Final   Special Requests   Final    BOTTLES DRAWN AEROBIC AND ANAEROBIC Blood Culture adequate volume   Culture   Final    NO GROWTH < 24 HOURS Performed at [facility], [address], [place], [place] [address]    Report Status PENDING  Incomplete  Culture, blood (Routine X 2) w Reflex to ID Panel     Status: None (Preliminary result)   Collection Time: 07/17/19  7:16 AM   Specimen: BLOOD  Result Value Ref Range Status   Specimen Description BLOOD LEFT FA  Final   Special Requests   Final     BOTTLES DRAWN AEROBIC AND ANAEROBIC Blood Culture adequate volume   Culture   Final    NO GROWTH < 24 HOURS Performed at [facility], [address], [place], [place] [address]    Report Status PENDING  Incomplete  Culture, respiratory     Status: None (Preliminary result)   Collection Time: 07/17/19  7:16 AM  Result Value Ref Range Status   Specimen Description   Final    EXPECTORATED SPUTUM Performed at [facility], [address], [place], [place] [address]    Special Requests   Final    NONE Reflexed from [id number] Performed at [facility], [address] [place], [place] [address]    Gram Stain   Final    FEW WBC PRESENT, PREDOMINANTLY PMN RARE SQUAMOUS EPITHELIAL CELLS PRESENT ABUNDANT GRAM POSITIVE COCCI MODERATE GRAM POSITIVE RODS FEW GRAM NEGATIVE RODS    Culture   Final    CULTURE REINCUBATED FOR BETTER GROWTH Performed at [facility], [place] [address], [place], [place] [address]    Report Status PENDING  Incomplete     Studies: DG Chest 1 View  Result Date: 07/17/2019 CLINICAL DATA:  Day 11, COVID-19, low oxygen saturation EXAM: CHEST  1 VIEW COMPARISON:  Radiograph 04/26/2018 FINDINGS: Widespread multifocal interstitial and airspace opacities throughout both lungs with a basilar and peripheral predominance. Cardiomediastinal contours are similar to prior accounting for differences in technique. No pneumothorax or effusion. No acute osseous or soft tissue abnormality. IMPRESSION: Widespread interstitial and airspace opacities throughout both lungs compatible with multifocal pneumonia in the setting of COVID-19. Electronically Signed   By: Lovena Le M.D.   On: 07/17/2019 03:43    Scheduled Meds: . vitamin C  500 mg Oral Daily  . aspirin EC  81 mg Oral Daily  . azithromycin  250 mg Oral Daily  . cholecalciferol  1,000 Units Oral Daily  . dexamethasone (DECADRON) injection  6 mg Intravenous Q24H  . enoxaparin (LOVENOX) injection   40 mg Subcutaneous Q12H  . famotidine  20 mg Oral BID  . guaiFENesin  600 mg Oral BID  . irbesartan  75 mg Oral Daily   And  . hydrochlorothiazide  12.5 mg Oral Daily  . insulin aspart  0-5 Units Subcutaneous QHS  . insulin aspart  0-9 Units Subcutaneous TID WC  . zinc sulfate  220 mg Oral Daily   Continuous Infusions: . cefTRIAXone (ROCEPHIN)  IV 2 g (07/18/19 0853)  . remdesivir 100 mg in NS 100 mL 100 mg (07/18/19 1043)    Assessment/Plan:  1. Covid-19 pneumonia.  Remdesivir day 2 today.  Patient also on Rocephin and Zithromax.  Continue Decadron.  Continue  zinc and vitamin C. 2. Acute hypoxic respiratory failure.  Patient tapered down to 2 L.  Pulse ox on room air 88% this morning.  Hopefully can get off oxygen tomorrow. 3. Essential hypertension on irbesartan and hydrochlorothiazide 4. Type 2 diabetes controlled.  Hemoglobin A1c 6.9.  Continue on sliding scale while on IV steroids.  Code Status:     Code Status Orders  (From admission, onward)         Start     Ordered   07/17/19 0423  Full code  Continuous     07/17/19 0430        Code Status History    This patient has a current code status but no historical code status.   Advance Care Planning Activity     Family Communication: Tried to call her husband who is also admitted but his phone was busy in the room. Disposition Plan: Evaluate daily.  Has 3 more doses of remdesivir, potential Sunday discharge.  Antibiotics:  Rocephin  Zithromax  Remdesivir  Time spent: 28 minutes  [place]

## 2019-07-18 NOTE — Progress Notes (Signed)
Pt designated caller updated. No questions at this time.

## 2019-07-18 NOTE — Progress Notes (Signed)
Tried weaning pt to RA without success. Pt O2 89% on RA while ambulating to bathroom and back. Pt 91% on RA while in bed shortly after ambulation. Placed pt on 1L at this time. Will continue to monitor.

## 2019-07-18 NOTE — Progress Notes (Signed)
Patient had  5 second episode of elevated heart rate with bigeminy. Patient sleeping. Easily aroused. No pain. Vital signs stable.

## 2019-07-19 DIAGNOSIS — E876 Hypokalemia: Secondary | ICD-10-CM

## 2019-07-19 LAB — CBC WITH DIFFERENTIAL/PLATELET
Abs Immature Granulocytes: 0.07 10*3/uL (ref 0.00–0.07)
Basophils Absolute: 0 10*3/uL (ref 0.0–0.1)
Basophils Relative: 0 %
Eosinophils Absolute: 0.1 10*3/uL (ref 0.0–0.5)
Eosinophils Relative: 1 %
HCT: 41.9 % (ref 36.0–46.0)
Hemoglobin: 13.3 g/dL (ref 12.0–15.0)
Immature Granulocytes: 1 %
Lymphocytes Relative: 23 %
Lymphs Abs: 2.3 10*3/uL (ref 0.7–4.0)
MCH: 26.9 pg (ref 26.0–34.0)
MCHC: 31.7 g/dL (ref 30.0–36.0)
MCV: 84.8 fL (ref 80.0–100.0)
Monocytes Absolute: 0.9 10*3/uL (ref 0.1–1.0)
Monocytes Relative: 9 %
Neutro Abs: 6.5 10*3/uL (ref 1.7–7.7)
Neutrophils Relative %: 66 %
Platelets: 408 10*3/uL — ABNORMAL HIGH (ref 150–400)
RBC: 4.94 MIL/uL (ref 3.87–5.11)
RDW: 14.6 % (ref 11.5–15.5)
Smear Review: NORMAL
WBC: 9.8 10*3/uL (ref 4.0–10.5)
nRBC: 0 % (ref 0.0–0.2)

## 2019-07-19 LAB — GLUCOSE, CAPILLARY
Glucose-Capillary: 130 mg/dL — ABNORMAL HIGH (ref 70–99)
Glucose-Capillary: 113 mg/dL — ABNORMAL HIGH (ref 70–99)
Glucose-Capillary: 137 mg/dL — ABNORMAL HIGH (ref 70–99)
Glucose-Capillary: 191 mg/dL — ABNORMAL HIGH (ref 70–99)
Glucose-Capillary: 110 mg/dL — ABNORMAL HIGH (ref 70–99)

## 2019-07-19 LAB — COMPREHENSIVE METABOLIC PANEL
ALT: 29 U/L (ref 0–44)
AST: 26 U/L (ref 15–41)
Albumin: 3.2 g/dL — ABNORMAL LOW (ref 3.5–5.0)
Alkaline Phosphatase: 65 U/L (ref 38–126)
Anion gap: 10 (ref 5–15)
BUN: 24 mg/dL — ABNORMAL HIGH (ref 6–20)
CO2: 27 mmol/L (ref 22–32)
Calcium: 8.5 mg/dL — ABNORMAL LOW (ref 8.9–10.3)
Chloride: 103 mmol/L (ref 98–111)
Creatinine, Ser: 0.99 mg/dL (ref 0.44–1.00)
GFR calc Af Amer: >60 mL/min (ref >60)
GFR calc non Af Amer: >60 mL/min (ref >60)
Glucose, Bld: 111 mg/dL — ABNORMAL HIGH (ref 70–99)
Potassium: 3.3 mmol/L — ABNORMAL LOW (ref 3.5–5.1)
Sodium: 140 mmol/L (ref 135–145)
Total Bilirubin: 0.5 mg/dL (ref 0.3–1.2)
Total Protein: 7.5 g/dL (ref 6.5–8.1)

## 2019-07-19 LAB — CULTURE, RESPIRATORY W GRAM STAIN: Culture: NORMAL

## 2019-07-19 LAB — C-REACTIVE PROTEIN: CRP: 2.6 mg/dL — ABNORMAL HIGH (ref <1.0)

## 2019-07-19 LAB — FIBRIN DERIVATIVES D-DIMER (ARMC ONLY): Fibrin derivatives D-dimer (ARMC): 912.01 ng/mL (FEU) — ABNORMAL HIGH (ref 0.00–499.00)

## 2019-07-19 LAB — FERRITIN: Ferritin: 266 ng/mL (ref 11–307)

## 2019-07-19 MED ORDER — POTASSIUM CHLORIDE CRYS ER 20 MEQ PO TBCR
40.0000 meq | EXTENDED_RELEASE_TABLET | Freq: Once | ORAL | Status: AC
  Administered 2019-07-19: 17:00:00 40 meq via ORAL
  Filled 2019-07-19: qty 2

## 2019-07-19 MED ORDER — POLYETHYLENE GLYCOL 3350 17 G PO PACK
17.0000 g | PACK | Freq: Every day | ORAL | Status: DC
  Administered 2019-07-19 – 2019-07-21 (×3): 17 g via ORAL
  Filled 2019-07-19 (×3): qty 1

## 2019-07-19 NOTE — Plan of Care (Signed)

## 2019-07-19 NOTE — Progress Notes (Signed)
Attempted to call mother for updates but was unable to reach her.

## 2019-07-19 NOTE — Progress Notes (Signed)
Patient ID: Elizabeth Barrett, female   DOB: [birth year], 54 y.o.   MRN: [id number] [facility] PROGRESS NOTE  Elizabeth Barrett [id number] DOB: 1966-01-09 DOA: 07/17/2019 PCP: Adin Hector, MD  HPI/Subjective: Patient feeling better today.  Able to take a deeper breath.  Still with a little shortness of breath and a little cough.  Objective: Vitals:   07/19/19 0010 07/19/19 0848  BP: (!) 149/65 120/76  Pulse: 79 81  Resp: 18 17  Temp: 98.1 F (36.7 C) 98.2 F (36.8 C)  SpO2: 94% 94%    Intake/Output Summary (Last 24 hours) at 07/19/2019 1401 Last data filed at 07/19/2019 1000 Gross per 24 hour  Intake 440 ml  Output --  Net 440 ml   Filed Weights   07/16/19 2326  Weight: 114.8 kg    ROS: Review of Systems  Constitutional: Negative for chills and fever.  Eyes: Negative for blurred vision.  Respiratory: Positive for cough and shortness of breath.   Cardiovascular: Negative for chest pain.  Gastrointestinal: Positive for constipation. Negative for abdominal pain, diarrhea, nausea and vomiting.  Genitourinary: Negative for dysuria.  Musculoskeletal: Negative for joint pain.  Neurological: Negative for dizziness and headaches.   Exam: Physical Exam  Constitutional: She is oriented to person, place, and time.  HENT:  Nose: No mucosal edema.  Mouth/Throat: No oropharyngeal exudate or posterior oropharyngeal edema.  Eyes: Conjunctivae and lids are normal.  Neck: Carotid bruit is not present.  Cardiovascular: S1 normal and S2 normal. Exam reveals no gallop.  No murmur heard. Respiratory: No respiratory distress. She has decreased breath sounds in the right lower field and the left lower field. She has no wheezes. She has no rhonchi. She has no rales.  GI: Soft. Bowel sounds are normal. There is no abdominal tenderness.  Musculoskeletal:     Right ankle: No swelling.     Left ankle: No swelling.  Lymphadenopathy:    She has no cervical adenopathy.  Neurological: She  is alert and oriented to person, place, and time. No cranial nerve deficit.  Skin: Skin is warm. No rash noted. Nails show no clubbing.  Psychiatric: She has a normal mood and affect.      Data Reviewed: Basic Metabolic Panel: Recent Labs  Lab 07/16/19 2329 07/18/19 0329 07/19/19 0819  NA 137 140 140  K 3.7 4.0 3.3*  CL 98 103 103  CO2 30 26 27   GLUCOSE 135* 133* 111*  BUN 24* 22* 24*  CREATININE 1.10* 0.88 0.99  CALCIUM 8.8* 8.7* 8.5*   CBC: Recent Labs  Lab 07/16/19 2329 07/18/19 0329 07/19/19 0819  WBC 12.2* 11.4* 9.8  NEUTROABS  --  8.2* 6.5  HGB 13.9 12.8 13.3  HCT 43.3 40.5 41.9  MCV 83.3 84.7 84.8  PLT 345 361 408*     Recent Results (from the past 240 hour(s))  Culture, sputum-assessment     Status: None   Collection Time: 07/17/19  7:16 AM   Specimen: Expectorated Sputum  Result Value Ref Range Status   Specimen Description EXPECTORATED SPUTUM  Final   Special Requests NONE  Final   Sputum evaluation   Final    THIS SPECIMEN IS ACCEPTABLE FOR SPUTUM CULTURE Performed at [facility], [address] [place], [place] [address]    Report Status 07/17/2019 FINAL  Final  Culture, blood (Routine X 2) w Reflex to ID Panel     Status: None (Preliminary result)   Collection Time: 07/17/19  7:16 AM  Specimen: BLOOD  Result Value Ref Range Status   Specimen Description BLOOD LEFT AC  Final   Special Requests   Final    BOTTLES DRAWN AEROBIC AND ANAEROBIC Blood Culture adequate volume   Culture   Final    NO GROWTH 2 DAYS Performed at [facility], [address] [place], [place] [address]    Report Status PENDING  Incomplete  Culture, blood (Routine X 2) w Reflex to ID Panel     Status: None (Preliminary result)   Collection Time: 07/17/19  7:16 AM   Specimen: BLOOD  Result Value Ref Range Status   Specimen Description BLOOD LEFT FA  Final   Special Requests   Final    BOTTLES DRAWN AEROBIC AND ANAEROBIC Blood Culture adequate  volume   Culture   Final    NO GROWTH 2 DAYS Performed at [facility], [address], [place], [place] [address]    Report Status PENDING  Incomplete  Culture, respiratory     Status: None   Collection Time: 07/17/19  7:16 AM  Result Value Ref Range Status   Specimen Description   Final    EXPECTORATED SPUTUM Performed at [facility], [address], [place], [place] [address]    Special Requests   Final    NONE Reflexed from [id number] Performed at [facility], [address] [place], [place] [address]    Gram Stain   Final    FEW WBC PRESENT, PREDOMINANTLY PMN RARE SQUAMOUS EPITHELIAL CELLS PRESENT ABUNDANT GRAM POSITIVE COCCI MODERATE GRAM POSITIVE RODS FEW GRAM NEGATIVE RODS    Culture   Final    Consistent with normal respiratory flora. Performed at [facility], [place] [address] [place], [place] [address]    Report Status 07/19/2019 FINAL  Final    Scheduled Meds: . vitamin C  500 mg Oral Daily  . aspirin EC  81 mg Oral Daily  . azithromycin  250 mg Oral Daily  . cholecalciferol  1,000 Units Oral Daily  . dexamethasone (DECADRON) injection  6 mg Intravenous Q24H  . enoxaparin (LOVENOX) injection  40 mg Subcutaneous Q12H  . famotidine  20 mg Oral BID  . guaiFENesin  600 mg Oral BID  . irbesartan  75 mg Oral Daily   And  . hydrochlorothiazide  12.5 mg Oral Daily  . insulin aspart  0-5 Units Subcutaneous QHS  . insulin aspart  0-9 Units Subcutaneous TID WC  . polyethylene glycol  17 g Oral Daily  . potassium chloride  40 mEq Oral Once  . zinc sulfate  220 mg Oral Daily   Continuous Infusions: . cefTRIAXone (ROCEPHIN)  IV Stopped (07/19/19 1026)  . remdesivir 100 mg in NS 100 mL 100 mg (07/19/19 1028)    Assessment/Plan:  1. Covid-19 pneumonia.  Remdesivir day 3 today.  Patient also on Rocephin and Zithromax.  Continue Decadron.  Continue zinc and vitamin C. The patient overall is feeling better than when she came in.   Anticipate discharge on Sunday after fifth dose of remdesivir 2. Acute hypoxic respiratory failure.  Patient.  Off oxygen today 3. Essential hypertension on irbesartan and hydrochlorothiazide 4. Type 2 diabetes controlled.  Hemoglobin A1c 6.9.  Continue on sliding scale while on IV steroids. 5. Hypokalemia replace potassium.  If this becomes an issue as outpatient may have to get rid of hydrochlorothiazide.  Code Status:     Code Status Orders  (From admission, onward)  Start     Ordered   07/17/19 0423  Full code  Continuous     07/17/19 0430        Code Status History    This patient has a current code status but no historical code status.   Advance Care Planning Activity     Family Communication: Called husband on the phone and given update. Disposition Plan: Discharge Sunday morning  Antibiotics:  Rocephin  Zithromax  Remdesivir  Time spent: 27 minutes  [place]

## 2019-07-20 DIAGNOSIS — K5900 Constipation, unspecified: Secondary | ICD-10-CM

## 2019-07-20 LAB — COMPREHENSIVE METABOLIC PANEL
ALT: 27 U/L (ref 0–44)
AST: 20 U/L (ref 15–41)
Albumin: 3.1 g/dL — ABNORMAL LOW (ref 3.5–5.0)
Alkaline Phosphatase: 65 U/L (ref 38–126)
Anion gap: 11 (ref 5–15)
BUN: 25 mg/dL — ABNORMAL HIGH (ref 6–20)
CO2: 26 mmol/L (ref 22–32)
Calcium: 8.5 mg/dL — ABNORMAL LOW (ref 8.9–10.3)
Chloride: 101 mmol/L (ref 98–111)
Creatinine, Ser: 0.88 mg/dL (ref 0.44–1.00)
GFR calc Af Amer: >60 mL/min (ref >60)
GFR calc non Af Amer: >60 mL/min (ref >60)
Glucose, Bld: 124 mg/dL — ABNORMAL HIGH (ref 70–99)
Potassium: 3.9 mmol/L (ref 3.5–5.1)
Sodium: 138 mmol/L (ref 135–145)
Total Bilirubin: 0.6 mg/dL (ref 0.3–1.2)
Total Protein: 7.4 g/dL (ref 6.5–8.1)

## 2019-07-20 LAB — CBC WITH DIFFERENTIAL/PLATELET
Abs Immature Granulocytes: 0.11 10*3/uL — ABNORMAL HIGH (ref 0.00–0.07)
Basophils Absolute: 0 10*3/uL (ref 0.0–0.1)
Basophils Relative: 0 %
Eosinophils Absolute: 0.1 10*3/uL (ref 0.0–0.5)
Eosinophils Relative: 1 %
HCT: 42.3 % (ref 36.0–46.0)
Hemoglobin: 13.3 g/dL (ref 12.0–15.0)
Immature Granulocytes: 1 %
Lymphocytes Relative: 23 %
Lymphs Abs: 2.1 10*3/uL (ref 0.7–4.0)
MCH: 26.5 pg (ref 26.0–34.0)
MCHC: 31.4 g/dL (ref 30.0–36.0)
MCV: 84.3 fL (ref 80.0–100.0)
Monocytes Absolute: 1.1 10*3/uL — ABNORMAL HIGH (ref 0.1–1.0)
Monocytes Relative: 12 %
Neutro Abs: 5.7 10*3/uL (ref 1.7–7.7)
Neutrophils Relative %: 63 %
Platelets: 455 10*3/uL — ABNORMAL HIGH (ref 150–400)
RBC: 5.02 MIL/uL (ref 3.87–5.11)
RDW: 14.6 % (ref 11.5–15.5)
WBC: 9.2 10*3/uL (ref 4.0–10.5)
nRBC: 0 % (ref 0.0–0.2)

## 2019-07-20 LAB — C-REACTIVE PROTEIN: CRP: 2.6 mg/dL — ABNORMAL HIGH (ref <1.0)

## 2019-07-20 LAB — GLUCOSE, CAPILLARY
Glucose-Capillary: 111 mg/dL — ABNORMAL HIGH (ref 70–99)
Glucose-Capillary: 161 mg/dL — ABNORMAL HIGH (ref 70–99)
Glucose-Capillary: 203 mg/dL — ABNORMAL HIGH (ref 70–99)
Glucose-Capillary: 133 mg/dL — ABNORMAL HIGH (ref 70–99)

## 2019-07-20 LAB — FERRITIN: Ferritin: 243 ng/mL (ref 11–307)

## 2019-07-20 LAB — FIBRIN DERIVATIVES D-DIMER (ARMC ONLY): Fibrin derivatives D-dimer (ARMC): 602.03 ng/mL (FEU) — ABNORMAL HIGH (ref 0.00–499.00)

## 2019-07-20 MED ORDER — DEXAMETHASONE 4 MG PO TABS
6.0000 mg | ORAL_TABLET | Freq: Every day | ORAL | Status: DC
  Administered 2019-07-21: 09:00:00 6 mg via ORAL
  Filled 2019-07-20: qty 2

## 2019-07-20 NOTE — Progress Notes (Signed)
Patient ID: Elizabeth Barrett, female   DOB: 1965/07/26, 54 y.o.   MRN: [id number] [facility] PROGRESS NOTE  Elizabeth Barrett [id number] DOB: [birth year] DOA: 07/17/2019 PCP: Adin Hector, MD  HPI/Subjective: Patient feels a little bit better every day.  Able to take deep breath.  Some cough.  Still with some constipation.  Objective: Vitals:   07/19/19 2309 07/20/19 0747  BP: (!) 107/53 122/63  Pulse: 72 61  Resp: 17 20  Temp: 98 F (36.7 C) 98.1 F (36.7 C)  SpO2: 90% (!) 89%    Intake/Output Summary (Last 24 hours) at 07/20/2019 1356 Last data filed at 07/20/2019 0902 Gross per 24 hour  Intake 200 ml  Output --  Net 200 ml   Filed Weights   07/16/19 2326  Weight: 114.8 kg    ROS: Review of Systems  Constitutional: Negative for chills and fever.  Eyes: Negative for blurred vision.  Respiratory: Positive for cough and shortness of breath.   Cardiovascular: Negative for chest pain.  Gastrointestinal: Positive for constipation. Negative for abdominal pain, diarrhea, nausea and vomiting.  Genitourinary: Negative for dysuria.  Musculoskeletal: Negative for joint pain.  Neurological: Negative for dizziness and headaches.   Exam: Physical Exam  Constitutional: She is oriented to person, place, and time.  HENT:  Nose: No mucosal edema.  Mouth/Throat: No oropharyngeal exudate or posterior oropharyngeal edema.  Eyes: Conjunctivae and lids are normal.  Neck: Carotid bruit is not present.  Cardiovascular: S1 normal and S2 normal. Exam reveals no gallop.  No murmur heard. Respiratory: No respiratory distress. She has decreased breath sounds in the right lower field and the left lower field. She has no wheezes. She has no rhonchi. She has no rales.  GI: Soft. Bowel sounds are normal. There is no abdominal tenderness.  Musculoskeletal:     Right ankle: No swelling.     Left ankle: No swelling.  Lymphadenopathy:    She has no cervical adenopathy.  Neurological: She is  alert and oriented to person, place, and time. No cranial nerve deficit.  Skin: Skin is warm. No rash noted. Nails show no clubbing.  Psychiatric: She has a normal mood and affect.      Data Reviewed: Basic Metabolic Panel: Recent Labs  Lab 07/16/19 2329 07/18/19 0329 07/19/19 0819 07/20/19 0448  NA 137 140 140 138  K 3.7 4.0 3.3* 3.9  CL 98 103 103 101  CO2 30 26 27 26   GLUCOSE 135* 133* 111* 124*  BUN 24* 22* 24* 25*  CREATININE 1.10* 0.88 0.99 0.88  CALCIUM 8.8* 8.7* 8.5* 8.5*   CBC: Recent Labs  Lab 07/16/19 2329 07/18/19 0329 07/19/19 0819 07/20/19 0448  WBC 12.2* 11.4* 9.8 9.2  NEUTROABS  --  8.2* 6.5 5.7  HGB 13.9 12.8 13.3 13.3  HCT 43.3 40.5 41.9 42.3  MCV 83.3 84.7 84.8 84.3  PLT 345 361 408* 455*     Recent Results (from the past 240 hour(s))  Culture, sputum-assessment     Status: None   Collection Time: 07/17/19  7:16 AM   Specimen: Expectorated Sputum  Result Value Ref Range Status   Specimen Description EXPECTORATED SPUTUM  Final   Special Requests NONE  Final   Sputum evaluation   Final    THIS SPECIMEN IS ACCEPTABLE FOR SPUTUM CULTURE Performed at [facility], [address] [place], [place] [address]    Report Status 07/17/2019 FINAL  Final  Culture, blood (Routine X 2) w Reflex to  ID Panel     Status: None (Preliminary result)   Collection Time: 07/17/19  7:16 AM   Specimen: BLOOD  Result Value Ref Range Status   Specimen Description BLOOD LEFT AC  Final   Special Requests   Final    BOTTLES DRAWN AEROBIC AND ANAEROBIC Blood Culture adequate volume   Culture   Final    NO GROWTH 3 DAYS Performed at [facility], [address], [place], [place] [address]    Report Status PENDING  Incomplete  Culture, blood (Routine X 2) w Reflex to ID Panel     Status: None (Preliminary result)   Collection Time: 07/17/19  7:16 AM   Specimen: BLOOD  Result Value Ref Range Status   Specimen Description BLOOD LEFT FA  Final    Special Requests   Final    BOTTLES DRAWN AEROBIC AND ANAEROBIC Blood Culture adequate volume   Culture   Final    NO GROWTH 3 DAYS Performed at [facility], [address], [place], [place] [address]    Report Status PENDING  Incomplete  Culture, respiratory     Status: None   Collection Time: 07/17/19  7:16 AM  Result Value Ref Range Status   Specimen Description   Final    EXPECTORATED SPUTUM Performed at [facility], [address], [place], [place] [address]    Special Requests   Final    NONE Reflexed from [id number] Performed at [facility], [address] [place], [place] [address]    Gram Stain   Final    FEW WBC PRESENT, PREDOMINANTLY PMN RARE SQUAMOUS EPITHELIAL CELLS PRESENT ABUNDANT GRAM POSITIVE COCCI MODERATE GRAM POSITIVE RODS FEW GRAM NEGATIVE RODS    Culture   Final    Consistent with normal respiratory flora. Performed at [facility], [place] [address], [place], [place] [address]    Report Status 07/19/2019 FINAL  Final    Scheduled Meds: . vitamin C  500 mg Oral Daily  . aspirin EC  81 mg Oral Daily  . azithromycin  250 mg Oral Daily  . cholecalciferol  1,000 Units Oral Daily  . dexamethasone (DECADRON) injection  6 mg Intravenous Q24H  . enoxaparin (LOVENOX) injection  40 mg Subcutaneous Q12H  . famotidine  20 mg Oral BID  . guaiFENesin  600 mg Oral BID  . irbesartan  75 mg Oral Daily   And  . hydrochlorothiazide  12.5 mg Oral Daily  . insulin aspart  0-5 Units Subcutaneous QHS  . insulin aspart  0-9 Units Subcutaneous TID WC  . polyethylene glycol  17 g Oral Daily  . zinc sulfate  220 mg Oral Daily   Continuous Infusions: . cefTRIAXone (ROCEPHIN)  IV 2 g (07/20/19 1132)  . remdesivir 100 mg in NS 100 mL 100 mg (07/20/19 0902)    Assessment/Plan:  1. Covid-19 pneumonia.  Remdesivir day 4 today.  Patient also on Rocephin and Zithromax.  Continue Decadron.  Continue zinc and vitamin C. The patient overall is  feeling better than when she came in.  Anticipate discharge on Sunday after fifth dose of remdesivir 2. Acute hypoxic respiratory failure.  Patient.  Off oxygen today 3. Essential hypertension on irbesartan and hydrochlorothiazide 4. Type 2 diabetes controlled.  Hemoglobin A1c 6.9.  Continue on sliding scale while on IV steroids. 5. Hypokalemia.  Replace. 6. Constipation.  On MiraLAX  Code Status:     Code Status Orders  (From admission, onward)  Start     Ordered   07/17/19 0423  Full code  Continuous     07/17/19 0430        Code Status History    This patient has a current code status but no historical code status.   Advance Care Planning Activity     Disposition Plan: Discharge Sunday morning  Antibiotics:  Rocephin  Zithromax  Remdesivir  Time spent: 26 minutes  [place] [employer]

## 2019-07-20 NOTE — Progress Notes (Signed)
Designated contact Tawnya Crook was called @ [phone]. Updated on pt status. All questions addressed at time of call.

## 2019-07-21 DIAGNOSIS — E1169 Type 2 diabetes mellitus with other specified complication: Secondary | ICD-10-CM

## 2019-07-21 DIAGNOSIS — E785 Hyperlipidemia, unspecified: Secondary | ICD-10-CM

## 2019-07-21 LAB — CBC WITH DIFFERENTIAL/PLATELET
Abs Immature Granulocytes: 0.12 10*3/uL — ABNORMAL HIGH (ref 0.00–0.07)
Basophils Absolute: 0 10*3/uL (ref 0.0–0.1)
Basophils Relative: 0 %
Eosinophils Absolute: 0.1 10*3/uL (ref 0.0–0.5)
Eosinophils Relative: 1 %
HCT: 43.5 % (ref 36.0–46.0)
Hemoglobin: 13.7 g/dL (ref 12.0–15.0)
Immature Granulocytes: 1 %
Lymphocytes Relative: 28 %
Lymphs Abs: 3.1 10*3/uL (ref 0.7–4.0)
MCH: 26.6 pg (ref 26.0–34.0)
MCHC: 31.5 g/dL (ref 30.0–36.0)
MCV: 84.3 fL (ref 80.0–100.0)
Monocytes Absolute: 1.1 10*3/uL — ABNORMAL HIGH (ref 0.1–1.0)
Monocytes Relative: 10 %
Neutro Abs: 6.4 10*3/uL (ref 1.7–7.7)
Neutrophils Relative %: 60 %
Platelets: 483 10*3/uL — ABNORMAL HIGH (ref 150–400)
RBC: 5.16 MIL/uL — ABNORMAL HIGH (ref 3.87–5.11)
RDW: 14.5 % (ref 11.5–15.5)
Smear Review: NORMAL
WBC: 10.6 10*3/uL — ABNORMAL HIGH (ref 4.0–10.5)
nRBC: 0 % (ref 0.0–0.2)

## 2019-07-21 LAB — COMPREHENSIVE METABOLIC PANEL
ALT: 30 U/L (ref 0–44)
AST: 22 U/L (ref 15–41)
Albumin: 3.2 g/dL — ABNORMAL LOW (ref 3.5–5.0)
Alkaline Phosphatase: 59 U/L (ref 38–126)
Anion gap: 12 (ref 5–15)
BUN: 25 mg/dL — ABNORMAL HIGH (ref 6–20)
CO2: 26 mmol/L (ref 22–32)
Calcium: 8.8 mg/dL — ABNORMAL LOW (ref 8.9–10.3)
Chloride: 101 mmol/L (ref 98–111)
Creatinine, Ser: 0.8 mg/dL (ref 0.44–1.00)
GFR calc Af Amer: >60 mL/min (ref >60)
GFR calc non Af Amer: >60 mL/min (ref >60)
Glucose, Bld: 106 mg/dL — ABNORMAL HIGH (ref 70–99)
Potassium: 3.9 mmol/L (ref 3.5–5.1)
Sodium: 139 mmol/L (ref 135–145)
Total Bilirubin: 0.6 mg/dL (ref 0.3–1.2)
Total Protein: 7.4 g/dL (ref 6.5–8.1)

## 2019-07-21 LAB — FERRITIN: Ferritin: 262 ng/mL (ref 11–307)

## 2019-07-21 LAB — GLUCOSE, CAPILLARY: Glucose-Capillary: 99 mg/dL (ref 70–99)

## 2019-07-21 LAB — FIBRIN DERIVATIVES D-DIMER (ARMC ONLY): Fibrin derivatives D-dimer (ARMC): 513.52 ng/mL (FEU) — ABNORMAL HIGH (ref 0.00–499.00)

## 2019-07-21 LAB — C-REACTIVE PROTEIN: CRP: 1 mg/dL — ABNORMAL HIGH (ref <1.0)

## 2019-07-21 MED ORDER — ASCORBIC ACID 500 MG PO TABS: 500.0000 mg | ORAL_TABLET | Freq: Every day | ORAL | 0 refills | Status: AC

## 2019-07-21 MED ORDER — ASPIRIN 81 MG PO TBEC: 81.0000 mg | DELAYED_RELEASE_TABLET | Freq: Every day | ORAL | 0 refills | Status: DC

## 2019-07-21 MED ORDER — ALBUTEROL SULFATE HFA 108 (90 BASE) MCG/ACT IN AERS: 1.0000 | INHALATION_SPRAY | Freq: Four times a day (QID) | RESPIRATORY_TRACT | 0 refills | Status: AC | PRN

## 2019-07-21 MED ORDER — ZINC SULFATE 220 (50 ZN) MG PO CAPS: 220.0000 mg | ORAL_CAPSULE | Freq: Every day | ORAL | 0 refills | Status: AC

## 2019-07-21 MED ORDER — VITAMIN D3 25 MCG (1000 UNIT) PO TABS: 1000.0000 [IU] | ORAL_TABLET | Freq: Every day | ORAL | 0 refills | Status: AC

## 2019-07-21 MED ORDER — DEXAMETHASONE 1 MG PO TABS: ORAL_TABLET | ORAL | 0 refills | Status: AC

## 2019-07-21 NOTE — Discharge Summary (Signed)
[facility] NAME: Elizabeth Barrett    MR#:  [id number]  DATE OF BIRTH:  1966/01/09  DATE OF ADMISSION:  07/17/2019 ADMITTING PHYSICIAN: Christel Mormon, MD  DATE OF DISCHARGE: 07/21/2019 12:03 PM  PRIMARY CARE PHYSICIAN: Adin Hector, MD    ADMISSION DIAGNOSIS:  Cough [R05] Acute respiratory failure with hypoxia ([place]) [J96.01] Acute hypoxemic respiratory failure due to COVID-19 ([place]) [U07.1, J96.01] Pneumonia due to COVID-19 virus [U07.1, J12.82]  DISCHARGE DIAGNOSIS:  Active Problems:   Controlled type 2 diabetes mellitus without complication, without long-term current use of insulin (HCC)   Pneumonia due to COVID-19 virus   Hypokalemia   Constipation   SECONDARY DIAGNOSIS:   Past Medical History:  Diagnosis Date  . Chest pain   . Diabetes mellitus without complication ([place])   . Hyperlipidemia   . Hypertension   . Malaise   . Microscopic hematuria   . Obesity   . Vitamin D deficiency     HOSPITAL COURSE:   1.  COVID-19 pneumonia.  Patient received day 5 of remdesivir today.  Patient also finished Rocephin and Zithromax.  Decadron was given IV and was switched over to an oral taper upon discharge.  Continue zinc and vitamin C.  Albuterol inhaler as needed prescribed upon going home. 2.  Acute hypoxic respiratory failure.  This has resolved.  The patient was tapered off oxygen during the hospital course. 3.  Essential hypertension on irbesartan and hydrochlorothiazide here in the hospital and can go back on her Diovan HCT upon going home. 4.  Type 2 diabetes mellitus controlled.  Hemoglobin A1c 6.9.  The patient was given sliding scale insulin here in the hospital while on steroids IV.  Highest sugar was 203.  I discussed diabetic diet, exercise and weight loss with the patient.  No need for medications at this point. 5.  Hypokalemia.  This was replaced during the hospital course.  If this becomes an issue as outpatient can  consider getting rid of hydrochlorothiazide. 6.  Constipation.  This has resolved.  We gave MiraLAX here in the hospital 7.  Hyperlipidemia unspecified on pravastatin  DISCHARGE CONDITIONS:   Satisfactory  CONSULTS OBTAINED:  None  DRUG ALLERGIES:  No Known Allergies  DISCHARGE MEDICATIONS:   Allergies as of 07/21/2019   No Known Allergies     Medication List    STOP taking these medications   azithromycin 250 MG tablet Commonly known as: ZITHROMAX   predniSONE 10 MG tablet Commonly known as: DELTASONE     TAKE these medications   albuterol 108 (90 Base) MCG/ACT inhaler Commonly known as: VENTOLIN HFA Inhale 1 puff into the lungs every 6 (six) hours as needed for wheezing or shortness of breath.   ascorbic acid 500 MG tablet Commonly known as: VITAMIN C Take 1 tablet (500 mg total) by mouth daily.   aspirin 81 MG EC tablet Take 1 tablet (81 mg total) by mouth daily.   cholecalciferol 25 MCG (1000 UT) tablet Commonly known as: VITAMIN D Take 1 tablet (1,000 Units total) by mouth daily.   dexamethasone 1 MG tablet Commonly known as: DECADRON 4 tabs po day1; 3 tabs po day2; 2 tabs po day3,4, 1 tab po day 5,6 Start taking on: July 22, 2019   pravastatin 10 MG tablet Commonly known as: PRAVACHOL Take 1 tablet by mouth daily.   valsartan-hydrochlorothiazide 80-12.5 MG tablet Commonly known as: DIOVAN-HCT Take 1 tablet by mouth daily.  zinc sulfate 220 (50 Zn) MG capsule Take 1 capsule (220 mg total) by mouth daily.        DISCHARGE INSTRUCTIONS:   Follow-up PMD 1 week  If you experience worsening of your admission symptoms, develop shortness of breath, life threatening emergency, suicidal or homicidal thoughts you must seek medical attention immediately by calling 911 or calling your MD immediately  if symptoms less severe.  You Must read complete instructions/literature along with all the possible adverse reactions/side effects for all the  Medicines you take and that have been prescribed to you. Take any new Medicines after you have completely understood and accept all the possible adverse reactions/side effects.   Please note  You were cared for by a hospitalist during your hospital stay. If you have any questions about your discharge medications or the care you received while you were in the hospital after you are discharged, you can call the unit and asked to speak with the hospitalist on call if the hospitalist that took care of you is not available. Once you are discharged, your primary care physician will handle any further medical issues. Please note that NO REFILLS for any discharge medications will be authorized once you are discharged, as it is imperative that you return to your primary care physician (or establish a relationship with a primary care physician if you do not have one) for your aftercare needs so that they can reassess your need for medications and monitor your lab values.    Today   CHIEF COMPLAINT:   Chief Complaint  Patient presents with  . Other    COVID  . Nausea    HISTORY OF PRESENT ILLNESS:  Elizabeth Barrett  is a 54 y.o. female presented with nausea cough and shortness of breath.   VITAL SIGNS:  Blood pressure 127/64, pulse 70, temperature 98.1 F (36.7 C), resp. rate 18, height 5\' 6"  (1.676 m), weight 114.8 kg, SpO2 93 %.   PHYSICAL EXAMINATION:  GENERAL:  54 y.o.-year-old patient lying in the bed with no acute distress.  EYES: Pupils equal, round, reactive to light and accommodation. No scleral icterus. Extraocular muscles intact.  HEENT: Head atraumatic, normocephalic. Oropharynx and nasopharynx clear.  NECK:  Supple, no jugular venous distention. No thyroid enlargement, no tenderness.  LUNGS: Normal breath sounds bilaterally, no wheezing, rales,rhonchi or crepitation. No use of accessory muscles of respiration.  CARDIOVASCULAR: S1, S2 normal. No murmurs, rubs, or gallops.  ABDOMEN:  Soft, non-tender, non-distended. Bowel sounds present. No organomegaly or mass.  EXTREMITIES: No pedal edema, cyanosis, or clubbing.  NEUROLOGIC: Cranial nerves II through XII are intact. Muscle strength 5/5 in all extremities. Sensation intact. Gait not checked.  PSYCHIATRIC: The patient is alert and oriented x 3.  SKIN: No obvious rash, lesion, or ulcer.   DATA REVIEW:   CBC Recent Labs  Lab 07/21/19 0651  WBC 10.6*  HGB 13.7  HCT 43.5  PLT 483*    Chemistries  Recent Labs  Lab 07/21/19 0651  NA 139  K 3.9  CL 101  CO2 26  GLUCOSE 106*  BUN 25*  CREATININE 0.80  CALCIUM 8.8*  AST 22  ALT 30  ALKPHOS 24  BILITOT 0.6     Microbiology Results  Results for orders placed or performed during the hospital encounter of 07/17/19  Culture, sputum-assessment     Status: None   Collection Time: 07/17/19  7:16 AM   Specimen: Expectorated Sputum  Result Value Ref Range Status   Specimen Description  EXPECTORATED SPUTUM  Final   Special Requests NONE  Final   Sputum evaluation   Final    THIS SPECIMEN IS ACCEPTABLE FOR SPUTUM CULTURE Performed at [facility], [address] [place], [place] [address]    Report Status 07/17/2019 FINAL  Final  Culture, blood (Routine X 2) w Reflex to ID Panel     Status: None (Preliminary result)   Collection Time: 07/17/19  7:16 AM   Specimen: BLOOD  Result Value Ref Range Status   Specimen Description BLOOD LEFT [facility]  Final   Special Requests   Final    BOTTLES DRAWN AEROBIC AND ANAEROBIC Blood Culture adequate volume   Culture   Final    NO GROWTH 4 DAYS Performed at [facility], [address] [place], [place] [address]    Report Status PENDING  Incomplete  Culture, blood (Routine X 2) w Reflex to ID Panel     Status: None (Preliminary result)   Collection Time: 07/17/19  7:16 AM   Specimen: BLOOD  Result Value Ref Range Status   Specimen Description BLOOD LEFT FA  Final   Special Requests   Final     BOTTLES DRAWN AEROBIC AND ANAEROBIC Blood Culture adequate volume   Culture   Final    NO GROWTH 4 DAYS Performed at [facility], [address] [place], [place] [address]    Report Status PENDING  Incomplete  Culture, respiratory     Status: None   Collection Time: 07/17/19  7:16 AM  Result Value Ref Range Status   Specimen Description   Final    EXPECTORATED SPUTUM Performed at [facility], [address] [place], [place] [address]    Special Requests   Final    NONE Reflexed from [id number] Performed at [facility], [address] [place], [place] [address]    Gram Stain   Final    FEW WBC PRESENT, PREDOMINANTLY PMN RARE SQUAMOUS EPITHELIAL CELLS PRESENT ABUNDANT GRAM POSITIVE COCCI MODERATE GRAM POSITIVE RODS FEW GRAM NEGATIVE RODS    Culture   Final    Consistent with normal respiratory flora. Performed at [facility], [place] [address] [place],  [address]    Report Status 07/19/2019 FINAL  Final      Management plans discussed with the patient, family and they are in agreement.  CODE STATUS:     Code Status Orders  (From admission, onward)         Start     Ordered   07/17/19 0423  Full code  Continuous     07/17/19 0430        Code Status History    This patient has a current code status but no historical code status.   Advance Care Planning Activity      TOTAL TIME TAKING CARE OF THIS PATIENT: 35 minutes.    Loletha Grayer M.D on 07/21/2019 at 4:38 PM  Between 7am to 6pm - Pager - [phone]  After 6pm go to [email or web address] - password [email or web address]  [facility]  CC: Primary care physician; Adin Hector, MD

## 2019-07-21 NOTE — Discharge Instructions (Signed)
Quarantine through 07/23/2019  COVID-19 Frequently Asked Questions COVID-19 (coronavirus disease) is an infection that is caused by a large family of viruses. Some viruses cause illness in people and others cause illness in animals like camels, cats, and bats. In some cases, the viruses that cause illness in animals can spread to humans. Where did the coronavirus come from? In December 2019, [place] told the [facility]) of several cases of lung disease (human respiratory illness). These cases were linked to an open seafood and livestock market in the city of [place]. The link to the seafood and livestock market suggests that the virus may have spread from animals to humans. However, since that first outbreak in December, the virus has also been shown to spread from person to person. What is the name of the disease and the virus? Disease name Early on, this disease was called novel coronavirus. This is because scientists determined that the disease was caused by a new (novel) respiratory virus. The World Health Organization [facility]) has now named the disease COVID-19, or coronavirus disease. Virus name The virus that causes the disease is called severe acute respiratory syndrome coronavirus 2 (SARS-CoV-2). More information on disease and virus naming World Health Organization [facility]): www.who.int/emergencies/diseases/novel-coronavirus-2019/technical-guidance/naming-the-coronavirus-disease-(covid-2019)-and-the-virus-that-causes-it Who is at risk for complications from coronavirus disease? Some people may be at higher risk for complications from coronavirus disease. This includes older adults and people who have chronic diseases, such as heart disease, diabetes, and lung disease. If you are at higher risk for complications, take these extra precautions:  Stay home as much as possible.  Avoid social gatherings and travel.  Avoid close contact with others. Stay at least 6 ft (2 m) away from  others, if possible.  Wash your hands often with soap and water for at least 20 seconds.  Avoid touching your face, mouth, nose, or eyes.  Keep supplies on hand at home, such as food, medicine, and cleaning supplies.  If you must go out in public, wear a cloth face covering or face mask. Make sure your mask covers your nose and mouth. How does coronavirus disease spread? The virus that causes coronavirus disease spreads easily from person to person (is contagious). You may catch the virus by:  Breathing in droplets from an infected person. Droplets can be spread by a person breathing, speaking, singing, coughing, or sneezing.  Touching something, like a table or a doorknob, that was exposed to the virus (contaminated) and then touching your mouth, nose, or eyes. Can I get the virus from touching surfaces or objects? There is still a lot that we do not know about the virus that causes coronavirus disease. Scientists are basing a lot of information on what they know about similar viruses, such as:  Viruses cannot generally survive on surfaces for long. They need a human body (host) to survive.  It is more likely that the virus is spread by close contact with people who are sick (direct contact), such as through: ? Shaking hands or hugging. ? Breathing in respiratory droplets that travel through the air. Droplets can be spread by a person breathing, speaking, singing, coughing, or sneezing.  It is less likely that the virus is spread when a person touches a surface or object that has the virus on it (indirect contact). The virus may be able to enter the body if the person touches a surface or object and then touches his or her face, eyes, nose, or mouth. Can a person spread the virus without having  symptoms of the disease? It may be possible for the virus to spread before a person has symptoms of the disease, but this is most likely not the main way the virus is spreading. It is more likely  for the virus to spread by being in close contact with people who are sick and breathing in the respiratory droplets spread by a person breathing, speaking, singing, coughing, or sneezing. What are the symptoms of coronavirus disease? Symptoms vary from person to person and can range from mild to severe. Symptoms may include:  Fever or chills.  Cough.  Difficulty breathing or feeling short of breath.  Headaches, body aches, or muscle aches.  Runny or stuffy (congested) nose.  Sore throat.  New loss of taste or smell.  Nausea, vomiting, or diarrhea. These symptoms can appear anywhere from 2 to 14 days after you have been exposed to the virus. Some people may not have any symptoms. If you develop symptoms, call your health care provider. People with severe symptoms may need hospital care. Should I be tested for this virus? Your health care provider will decide whether to test you based on your symptoms, history of exposure, and your risk factors. How does a health care provider test for this virus? Health care providers will collect samples to send for testing. Samples may include:  Taking a swab of fluid from the back of your nose and throat, your nose, or your throat.  Taking fluid from the lungs by having you cough up mucus (sputum) into a sterile cup.  Taking a blood sample. Is there a treatment or vaccine for this virus? Currently, there is no vaccine to prevent coronavirus disease. Also, there are no medicines like antibiotics or antivirals to treat the virus. A person who becomes sick is given supportive care, which means rest and fluids. A person may also relieve his or her symptoms by using over-the-counter medicines that treat sneezing, coughing, and runny nose. These are the same medicines that a person takes for the common cold. If you develop symptoms, call your health care provider. People with severe symptoms may need hospital care. What can I do to protect myself and my  family from this virus?     You can protect yourself and your family by taking the same actions that you would take to prevent the spread of other viruses. Take the following actions:  Wash your hands often with soap and water for at least 20 seconds. If soap and water are not available, use alcohol-based hand sanitizer.  Avoid touching your face, mouth, nose, or eyes.  Cough or sneeze into a tissue, sleeve, or elbow. Do not cough or sneeze into your hand or the air. ? If you cough or sneeze into a tissue, throw it away immediately and wash your hands.  Disinfect objects and surfaces that you frequently touch every day.  Stay away from people who are sick.  Avoid going out in public, follow guidance from your state and local health authorities.  Avoid crowded indoor spaces. Stay at least 6 ft (2 m) away from others.  If you must go out in public, wear a cloth face covering or face mask. Make sure your mask covers your nose and mouth.  Stay home if you are sick, except to get medical care. Call your health care provider before you get medical care. Your health care provider will tell you how long to stay home.  Make sure your vaccines are up to date. Ask your  health care provider what vaccines you need. What should I do if I need to travel? Follow travel recommendations from your local health authority, the CDC, and WHO. Travel information and advice  Centers for Disease Control and Prevention (CDC): BodyEditor.hu  World Health Organization [facility]): ThirdIncome.ca Know the risks and take action to protect your health  You are at higher risk of getting coronavirus disease if you are traveling to areas with an outbreak or if you are exposed to travelers from areas with an outbreak.  Wash your hands often and practice good hygiene to lower the risk of catching or spreading the virus. What  should I do if I am sick? General instructions to stop the spread of infection  Wash your hands often with soap and water for at least 20 seconds. If soap and water are not available, use alcohol-based hand sanitizer.  Cough or sneeze into a tissue, sleeve, or elbow. Do not cough or sneeze into your hand or the air.  If you cough or sneeze into a tissue, throw it away immediately and wash your hands.  Stay home unless you must get medical care. Call your health care provider or local health authority before you get medical care.  Avoid public areas. Do not take public transportation, if possible.  If you can, wear a mask if you must go out of the house or if you are in close contact with someone who is not sick. Make sure your mask covers your nose and mouth. Keep your home clean  Disinfect objects and surfaces that are frequently touched every day. This may include: ? Counters and tables. ? Doorknobs and light switches. ? Sinks and faucets. ? Electronics such as phones, remote controls, keyboards, computers, and tablets.  Wash dishes in hot, soapy water or use a dishwasher. Air-dry your dishes.  Wash laundry in hot water. Prevent infecting other household members  Let healthy household members care for children and pets, if possible. If you have to care for children or pets, wash your hands often and wear a mask.  Sleep in a different bedroom or bed, if possible.  Do not share personal items, such as razors, toothbrushes, deodorant, combs, brushes, towels, and washcloths. Where to find more information Centers for Disease Control and Prevention (CDC)  Information and news updates: https://www.butler-gonzalez.com/ World Health Organization [facility])  Information and news updates: MissExecutive.com.ee  Coronavirus health topic: https://www.castaneda.info/  Questions and answers on COVID-19:  OpportunityDebt.at  Global tracker: who.sprinklr.com American Academy of Pediatrics (AAP)  Information for families: www.healthychildren.org/English/health-issues/conditions/chest-lungs/Pages/2019-Novel-Coronavirus.aspx The coronavirus situation is changing rapidly. Check your local health authority website or the CDC and [facility] websites for updates and news. When should I contact a health care provider?  Contact your health care provider if you have symptoms of an infection, such as fever or cough, and you: ? Have been near anyone who is known to have coronavirus disease. ? Have come into contact with a person who is suspected to have coronavirus disease. ? Have traveled to an area where there is an outbreak of COVID-19. When should I get emergency medical care?  Get help right away by calling your local emergency services (911 in the [place] if you have: ? Trouble breathing. ? Pain or pressure in your chest. ? Confusion. ? Blue-tinged lips and fingernails. ? Difficulty waking from sleep. ? Symptoms that get worse. Let the emergency medical personnel know if you think you have coronavirus disease. Summary  A new respiratory virus is spreading from person  to person and causing COVID-19 (coronavirus disease).  The virus that causes COVID-19 appears to spread easily. It spreads from one person to another through droplets from breathing, speaking, singing, coughing, or sneezing.  Older adults and those with chronic diseases are at higher risk of disease. If you are at higher risk for complications, take extra precautions.  There is currently no vaccine to prevent coronavirus disease. There are no medicines, such as antibiotics or antivirals, to treat the virus.  You can protect yourself and your family by washing your hands often, avoiding touching your face, and covering your coughs and sneezes. This information is not intended to replace advice given to you  by your health care provider. Make sure you discuss any questions you have with your health care provider. Document Revised: 04/26/2019 Document Reviewed: 10/23/2018 Elsevier Patient Education  [place].  COVID-19: How to Protect Yourself and Others Know how it spreads  There is currently no vaccine to prevent coronavirus disease 2019 (COVID-19).  The best way to prevent illness is to avoid being exposed to this virus.  The virus is thought to spread mainly from person-to-person. ? Between people who are in close contact with one another (within about 6 feet). ? Through respiratory droplets produced when an infected person coughs, sneezes or talks. ? These droplets can land in the mouths or noses of people who are nearby or possibly be inhaled into the lungs. ? COVID-19 may be spread by people who are not showing symptoms. Everyone should Clean your hands often  Wash your hands often with soap and water for at least 20 seconds especially after you have been in a public place, or after blowing your nose, coughing, or sneezing.  If soap and water are not readily available, use a hand sanitizer that contains at least 60% alcohol. Cover all surfaces of your hands and rub them together until they feel dry.  Avoid touching your eyes, nose, and mouth with unwashed hands. Avoid close contact  Limit contact with others as much as possible.  Avoid close contact with people who are sick.  Put distance between yourself and other people. ? Remember that some people without symptoms may be able to spread virus. ? This is especially important for people who are at higher risk of getting very GainPain.com.cy Cover your mouth and nose with a mask when around others  You could spread COVID-19 to others even if you do not feel sick.  Everyone should wear a mask in public settings and when around people not living in  their household, especially when social distancing is difficult to maintain. ? Masks should not be placed on young children under age 20, anyone who has trouble breathing, or is unconscious, incapacitated or otherwise unable to remove the mask without assistance.  The mask is meant to protect other people in case you are infected.  Do NOT use a facemask meant for a Dietitian.  Continue to keep about 6 feet between yourself and others. The mask is not a substitute for social distancing. Cover coughs and sneezes  Always cover your mouth and nose with a tissue when you cough or sneeze or use the inside of your elbow.  Throw used tissues in the trash.  Immediately wash your hands with soap and water for at least 20 seconds. If soap and water are not readily available, clean your hands with a hand sanitizer that contains at least 60% alcohol. Clean and disinfect  Clean AND disinfect frequently  touched surfaces daily. This includes tables, doorknobs, light switches, countertops, handles, desks, phones, keyboards, toilets, faucets, and sinks. RackRewards.fr  If surfaces are dirty, clean them: Use detergent or soap and water prior to disinfection.  Then, use a household disinfectant. You can see a list of EPA-registered household disinfectants here. michellinders.com 03/13/2019 This information is not intended to replace advice given to you by your health care provider. Make sure you discuss any questions you have with your health care provider. Document Revised: 03/21/2019 Document Reviewed: 01/17/2019 Elsevier Patient Education  [place].

## 2019-07-21 NOTE — Progress Notes (Signed)
Pt d/c to home with husband. IV removed intact. VSS. Education completed. All questions answered. All belongings sent with pt. Health Dept Covid paper signed and placed in shadow chart.

## 2019-07-22 LAB — CULTURE, BLOOD (ROUTINE X 2)
Culture: NO GROWTH
Culture: NO GROWTH
Special Requests: ADEQUATE
Special Requests: ADEQUATE

## 2019-08-02 ENCOUNTER — Other Ambulatory Visit: Payer: Self-pay

## 2020-05-13 ENCOUNTER — Other Ambulatory Visit
Admission: RE | Admit: 2020-05-13 | Discharge: 2020-05-13 | Disposition: A | Discharge status: Home / Self Care | Payer: 59 | Source: Ambulatory Visit | Attending: Internal Medicine | Admitting: Internal Medicine

## 2020-05-13 DIAGNOSIS — R0789 Other chest pain: Secondary | ICD-10-CM | POA: Diagnosis present

## 2020-05-13 LAB — TROPONIN I (HIGH SENSITIVITY): Troponin I (High Sensitivity): 5 ng/L (ref <18)

## 2020-05-14 ENCOUNTER — Other Ambulatory Visit: Payer: Self-pay | Admitting: Internal Medicine

## 2020-05-14 DIAGNOSIS — R0789 Other chest pain: Secondary | ICD-10-CM

## 2020-05-26 ENCOUNTER — Ambulatory Visit
Admission: RE | Admit: 2020-05-26 | Discharge: 2020-05-26 | Disposition: A | Discharge status: Home / Self Care | Payer: 59 | Source: Ambulatory Visit | Attending: Internal Medicine | Admitting: Internal Medicine

## 2020-05-26 ENCOUNTER — Other Ambulatory Visit: Payer: Self-pay

## 2020-05-26 DIAGNOSIS — R0789 Other chest pain: Secondary | ICD-10-CM | POA: Diagnosis present

## 2020-11-09 ENCOUNTER — Other Ambulatory Visit: Payer: Self-pay | Admitting: Internal Medicine

## 2020-11-09 DIAGNOSIS — Z1231 Encounter for screening mammogram for malignant neoplasm of breast: Secondary | ICD-10-CM

## 2020-11-16 ENCOUNTER — Ambulatory Visit
Admission: RE | Admit: 2020-11-16 | Discharge: 2020-11-16 | Disposition: A | Discharge status: Home / Self Care | Payer: 59 | Source: Ambulatory Visit | Attending: Internal Medicine | Admitting: Internal Medicine

## 2020-11-16 ENCOUNTER — Other Ambulatory Visit: Payer: Self-pay

## 2020-11-16 DIAGNOSIS — Z1231 Encounter for screening mammogram for malignant neoplasm of breast: Secondary | ICD-10-CM | POA: Diagnosis not present

## 2021-03-08 ENCOUNTER — Other Ambulatory Visit (HOSPITAL_COMMUNITY): Payer: Self-pay | Admitting: Specialist

## 2021-03-08 ENCOUNTER — Other Ambulatory Visit: Payer: Self-pay | Admitting: Specialist

## 2021-03-08 DIAGNOSIS — R0789 Other chest pain: Secondary | ICD-10-CM

## 2021-03-08 DIAGNOSIS — R911 Solitary pulmonary nodule: Secondary | ICD-10-CM

## 2021-05-31 ENCOUNTER — Ambulatory Visit: Admission: RE | Admit: 2021-05-31 | Payer: 59 | Source: Ambulatory Visit

## 2021-06-07 ENCOUNTER — Ambulatory Visit
Admission: RE | Admit: 2021-06-07 | Discharge: 2021-06-07 | Disposition: A | Discharge status: Home / Self Care | Payer: 59 | Source: Ambulatory Visit | Attending: Specialist | Admitting: Specialist

## 2021-06-07 ENCOUNTER — Other Ambulatory Visit: Payer: Self-pay

## 2021-06-07 DIAGNOSIS — R911 Solitary pulmonary nodule: Secondary | ICD-10-CM | POA: Insufficient documentation

## 2021-06-07 DIAGNOSIS — R0789 Other chest pain: Secondary | ICD-10-CM | POA: Diagnosis present

## 2021-12-04 IMAGING — MG MM DIGITAL SCREENING BILAT W/ TOMO AND CAD
8 of 15 series · 8 of 40 positions shown · non-contrast
Comparison: Previous exam(s).

CLINICAL DATA: Screening.

EXAM:
DIGITAL SCREENING BILATERAL MAMMOGRAM WITH TOMOSYNTHESIS AND CAD
TECHNIQUE: Bilateral screening digital craniocaudal and mediolateral oblique
mammograms were obtained. Bilateral screening digital breast
tomosynthesis was performed. The images were evaluated with
computer-aided detection.

[R CV synth-2D]
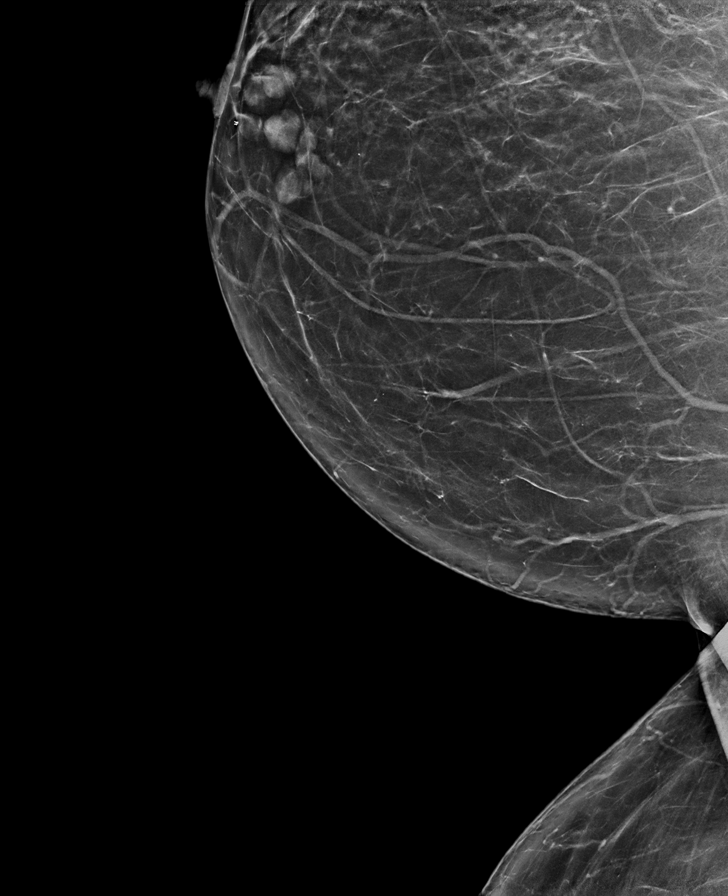

[R MLO synth-2D (1 of 2)]
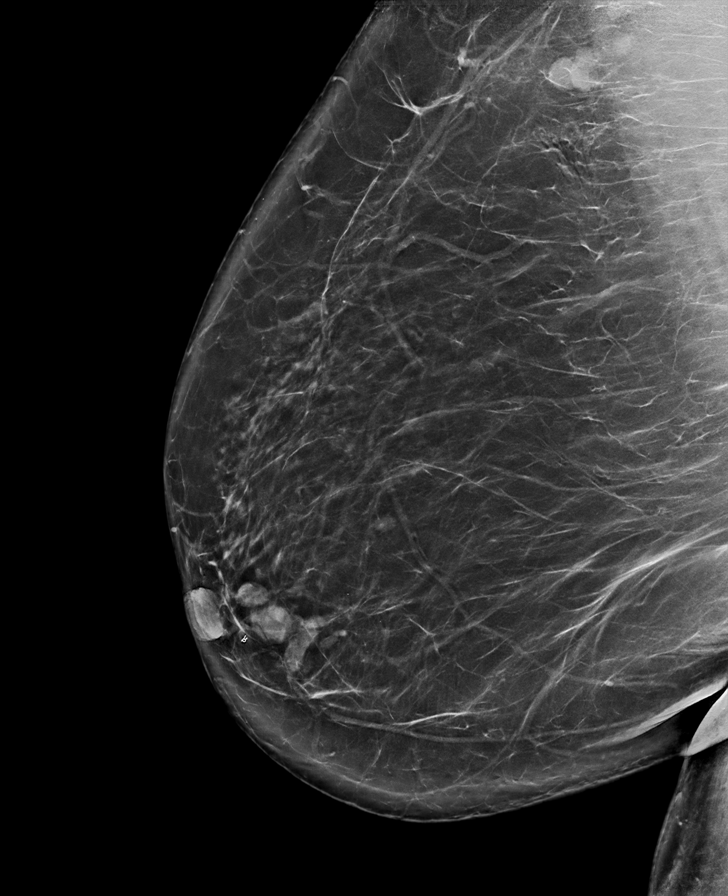

[R CC synth-2D]
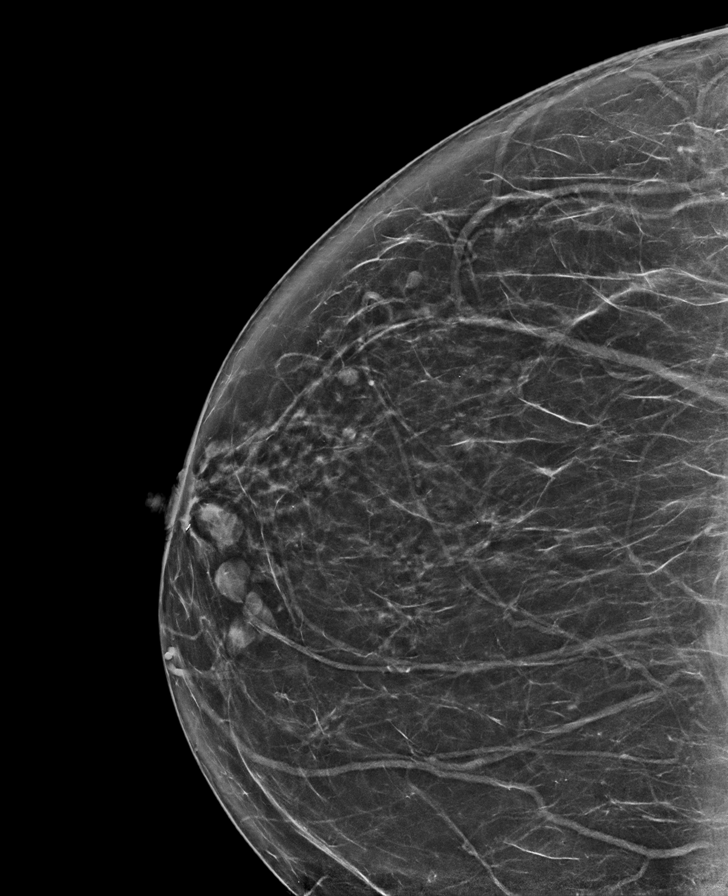

[L CV synth-2D]
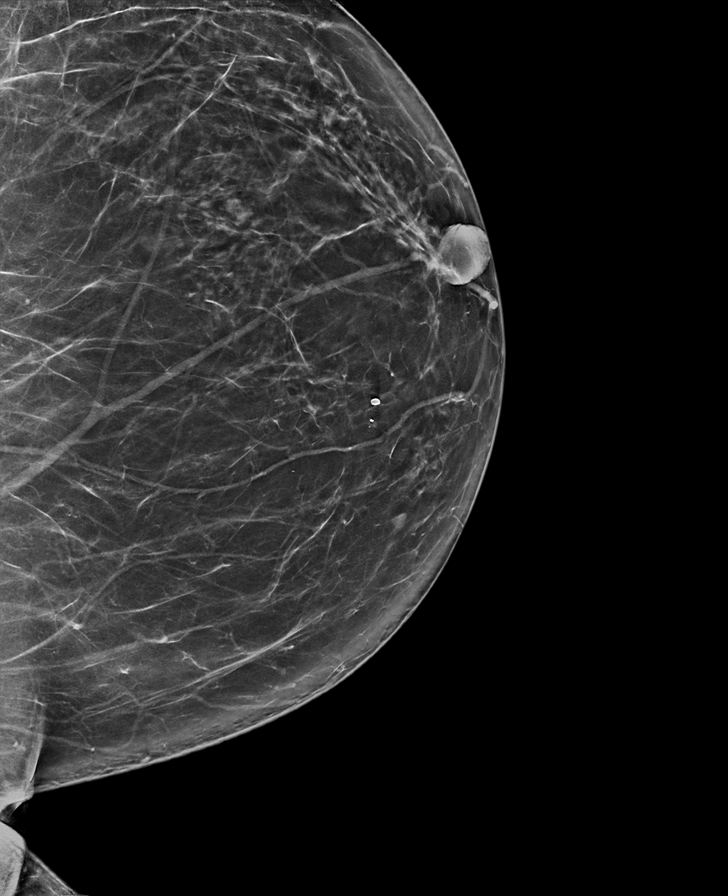

[R MLO synth-2D (2 of 2)]
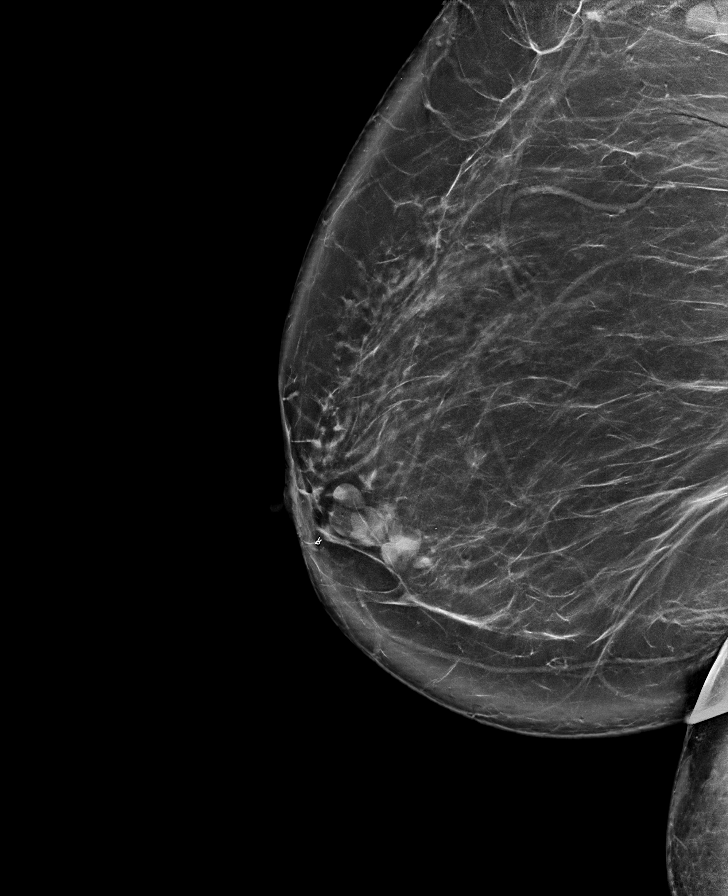

[L MLO synth-2D]
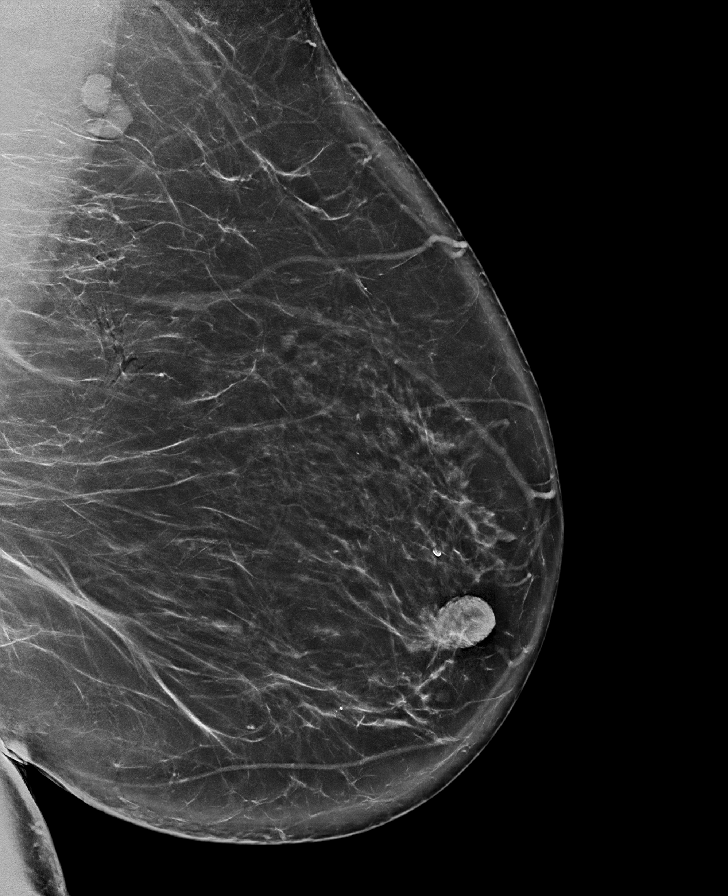

[L CC synth-2D]
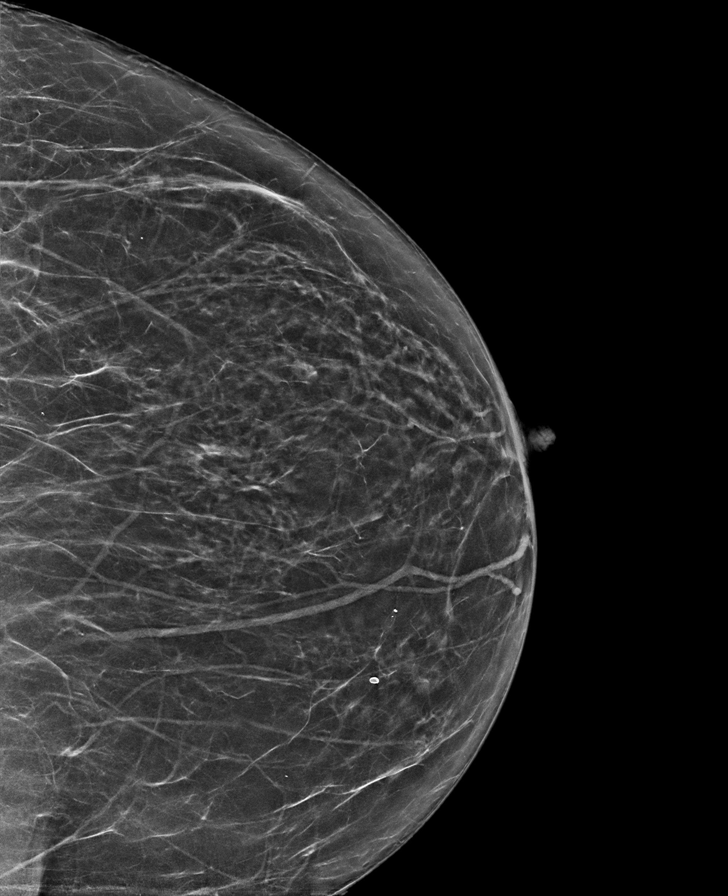

[R MLO tomo · tomo slice 68/100.0]
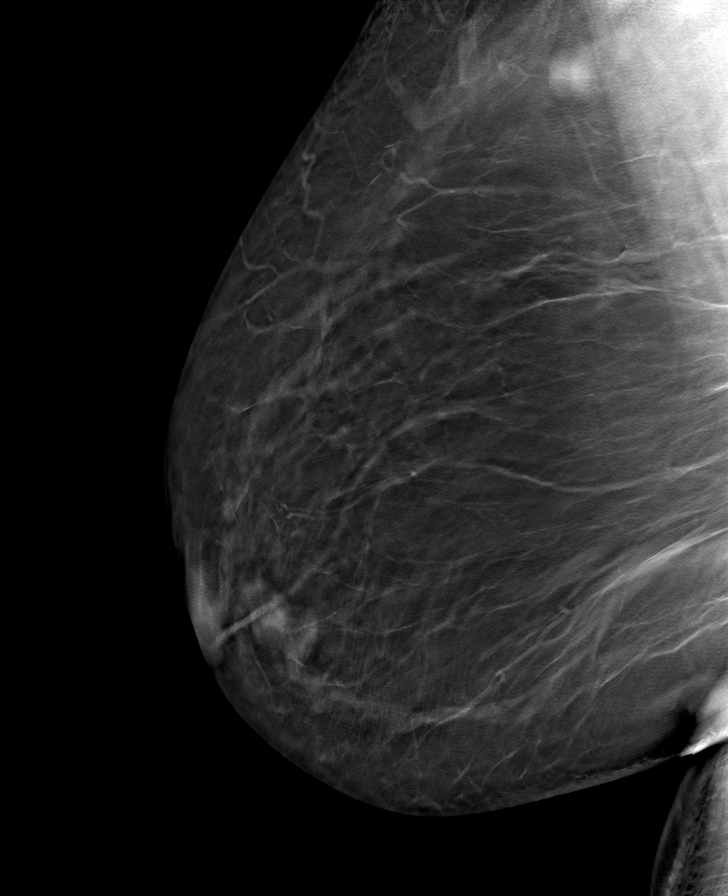

[8 of 40 positions shown; findings below may reference images not displayed]

ACR Breast Density Category b: There are scattered areas of
fibroglandular density.
FINDINGS: There are no findings suspicious for malignancy. The images were
evaluated with computer-aided detection.
IMPRESSION: No mammographic evidence of malignancy. A result letter of this
screening mammogram will be mailed directly to the patient.

RECOMMENDATION:
Screening mammogram in one year. (Code:[id number])

BI-RADS CATEGORY  1: Negative.

## 2022-03-17 ENCOUNTER — Encounter (INDEPENDENT_AMBULATORY_CARE_PROVIDER_SITE_OTHER): Payer: Self-pay | Admitting: Family Medicine

## 2022-04-05 ENCOUNTER — Emergency Department: Admission: EM | Admit: 2022-04-05 | Discharge: 2022-04-05 | Discharge status: Against Medical Advice | Payer: 59

## 2022-06-25 IMAGING — CT CT CHEST W/O CM
2 of 4 series · 15 of 36 positions shown, 18 images · non-contrast
Comparison: May 26, 2020.

CLINICAL DATA: 55-year-old female presents for follow-up of a 3 mm
LEFT lower lobe pulmonary nodule.

EXAM:
CT CHEST WITHOUT CONTRAST
TECHNIQUE: Multidetector CT imaging of the chest was performed following the
standard protocol without IV contrast.

[Series 2: chest 2.00 · axial · 0.68mm/px · z∈[-1161,-909]mm · 12 of 150 slices shown, 15 images]
[im 12/150  mediastinal]
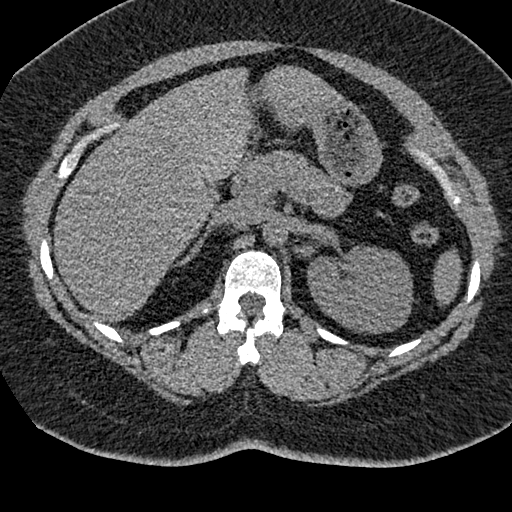
[im 12/150  lung]
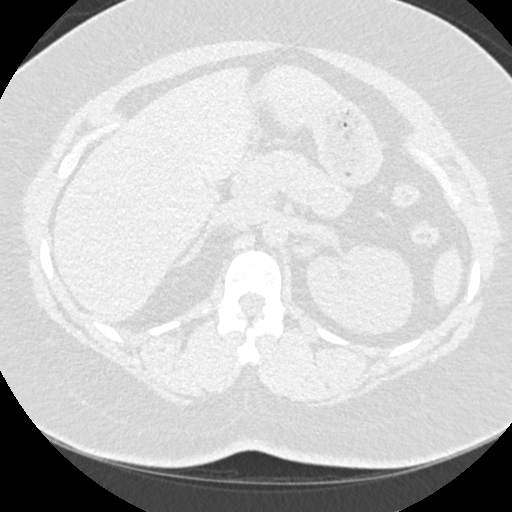
[im 23/150  lung]
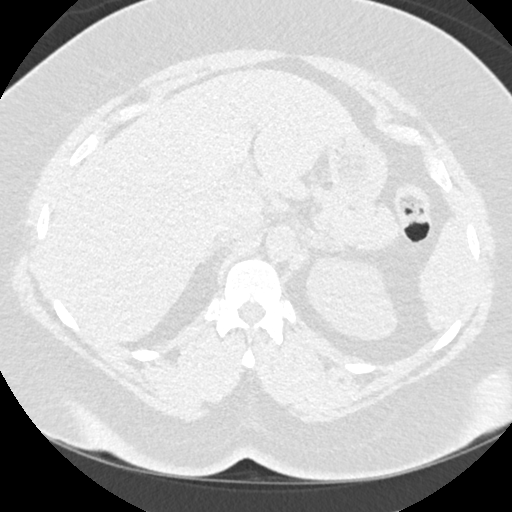
[im 35/150  lung]
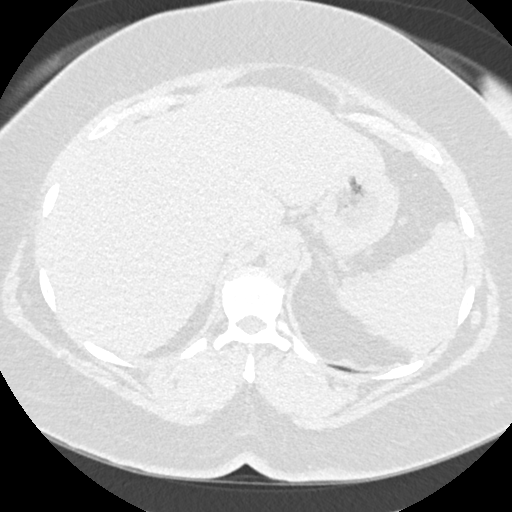
[im 46/150  lung]
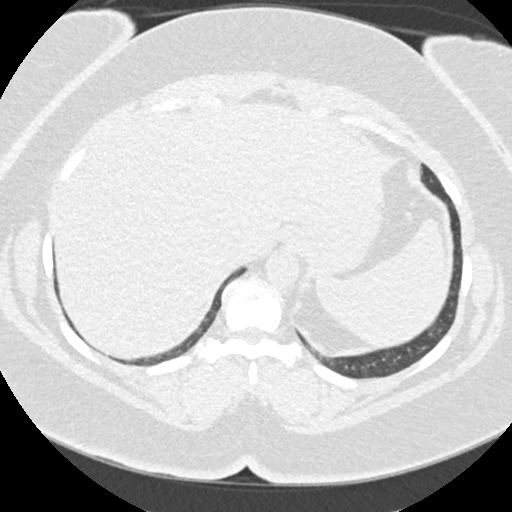
[im 58/150  mediastinal]
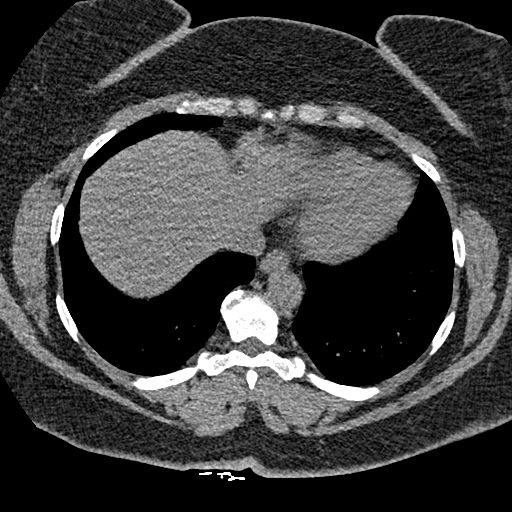
[im 58/150  lung]
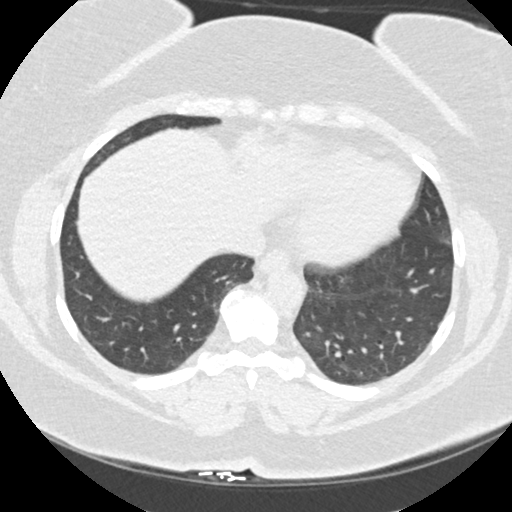
[im 69/150  lung]
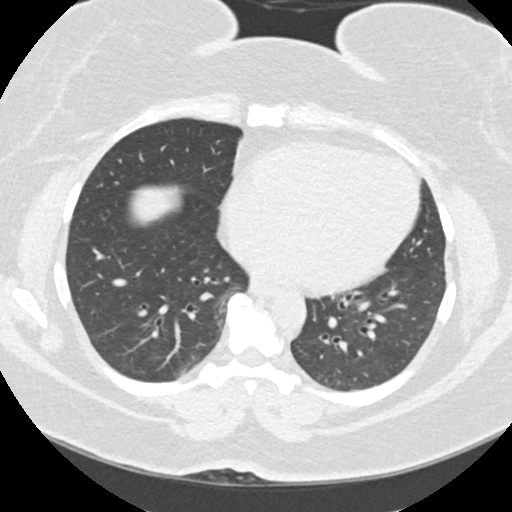
[im 81/150  lung]
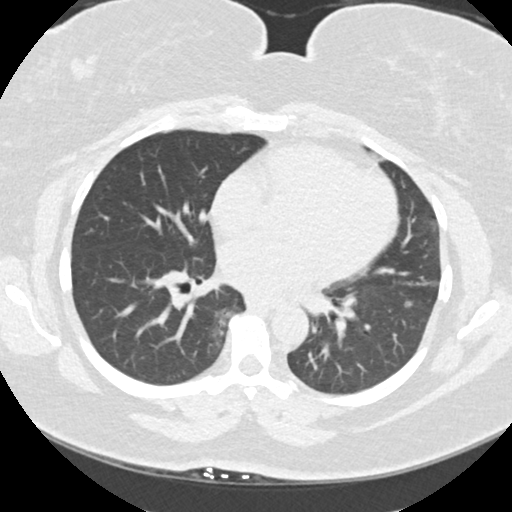
[im 92/150  lung]
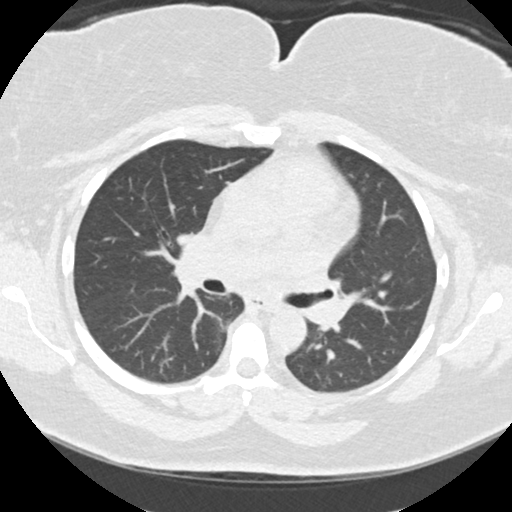
[im 104/150  mediastinal]
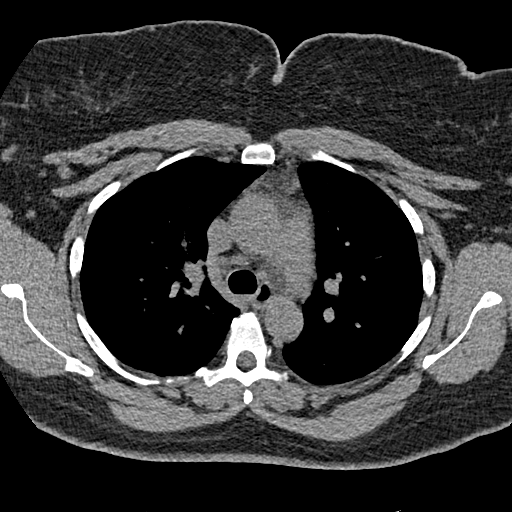
[im 104/150  lung]
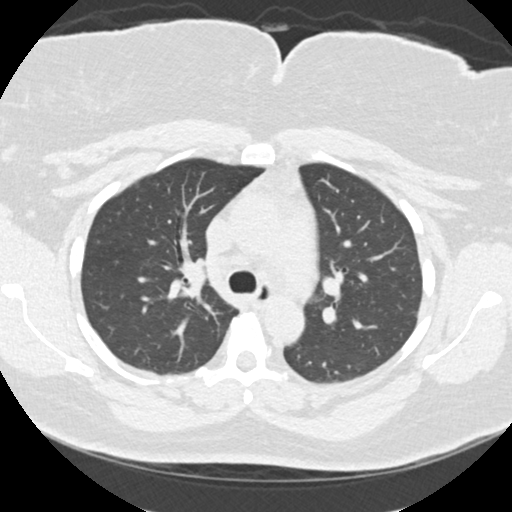
[im 115/150  lung]
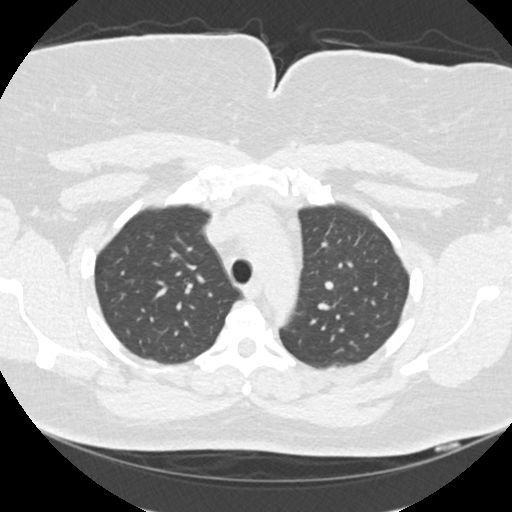
[im 127/150  lung]
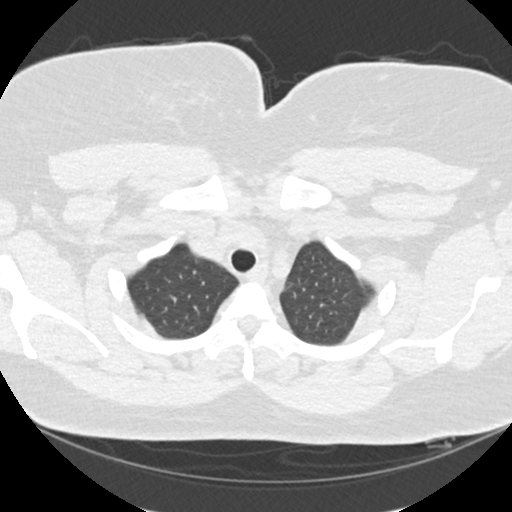
[im 138/150  lung]
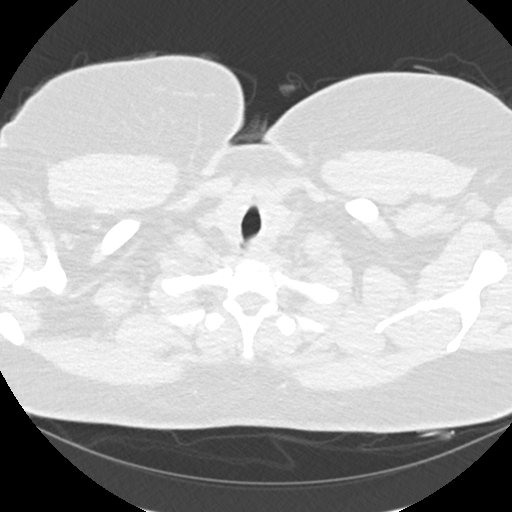

[Series 5: coronals chest 2.00 cor · coronal · 0.59mm/px · 3 of 165 slices shown]
[im 33/165  lung]
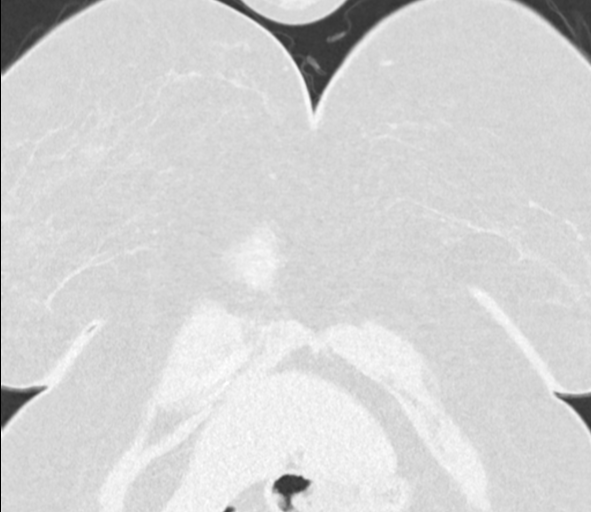
[im 66/165  lung]
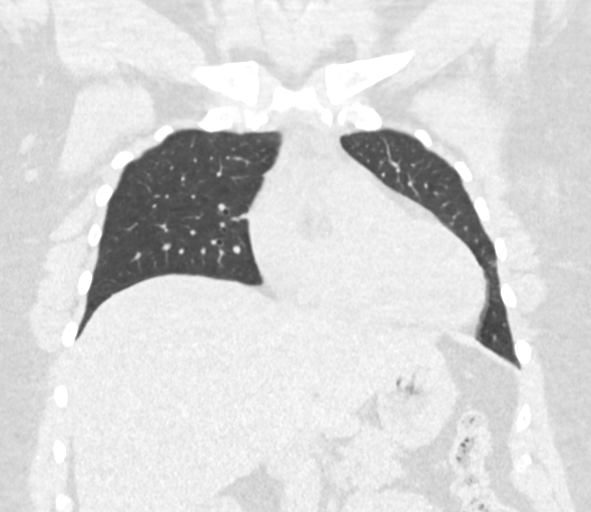
[im 99/165  lung]
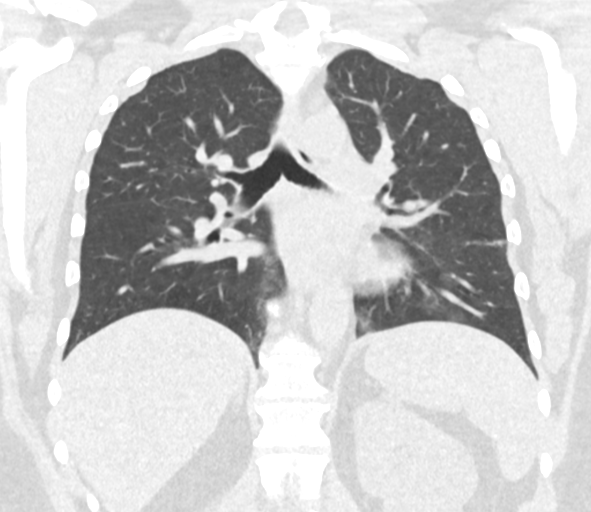

[15 of 36 positions shown; findings below may reference images not displayed]

FINDINGS: Cardiovascular: Normal noncontrast appearance of the heart great
vessels.

Mediastinum/Nodes: Esophagus grossly normal. No signs of adenopathy
in the chest

Lungs/Pleura: No sign of effusion. No sign of consolidative change.
Airways are patent. Mild scarring at the medial RIGHT lung base.

Anterior LEFT lower lobe pulmonary nodule (image 69/3) 3 mm, stable
when compared to remote imaging.

Upper Abdomen: Mild hepatic steatosis. No acute upper abdominal
process to the extent visualized.

Musculoskeletal: Nodularity in the retroareolar RIGHT breast showing
no gross change since previous imaging from Monday May, 2020 up to
12 mm greatest size. No acute bone finding. No destructive bone
process. Spinal degenerative changes.
IMPRESSION: Stable 3 mm LEFT lower lobe pulmonary nodule. Compatible with benign
pulmonary nodule.

No acute cardiopulmonary findings.

Mild hepatic steatosis.

Nodularity in the retroareolar RIGHT breast showing no gross change
since previous imaging from Monday May, 2020 up to 12 mm greatest
size. Mammographic correlation either with recent mammography or
follow-up mammogram as warranted is suggested. Findings are
nonspecific by CT.

## 2022-11-23 ENCOUNTER — Ambulatory Visit
Admission: RE | Admit: 2022-11-23 | Discharge: 2022-11-23 | Disposition: A | Discharge status: Home / Self Care | Payer: 59 | Source: Ambulatory Visit | Attending: Physician Assistant | Admitting: Physician Assistant

## 2022-11-23 ENCOUNTER — Other Ambulatory Visit: Payer: Self-pay

## 2022-11-23 ENCOUNTER — Other Ambulatory Visit: Payer: Self-pay | Admitting: Physician Assistant

## 2022-11-23 DIAGNOSIS — M7989 Other specified soft tissue disorders: Secondary | ICD-10-CM | POA: Insufficient documentation

## 2022-11-23 DIAGNOSIS — R2241 Localized swelling, mass and lump, right lower limb: Secondary | ICD-10-CM

## 2022-11-24 ENCOUNTER — Other Ambulatory Visit: Payer: 59

## 2023-04-12 DIAGNOSIS — J4520 Mild intermittent asthma, uncomplicated: Secondary | ICD-10-CM | POA: Diagnosis not present

## 2023-04-12 DIAGNOSIS — R911 Solitary pulmonary nodule: Secondary | ICD-10-CM | POA: Diagnosis not present

## 2023-04-12 DIAGNOSIS — Z579 Occupational exposure to unspecified risk factor: Secondary | ICD-10-CM | POA: Diagnosis not present

## 2023-04-12 DIAGNOSIS — G4733 Obstructive sleep apnea (adult) (pediatric): Secondary | ICD-10-CM | POA: Diagnosis not present

## 2023-04-17 DIAGNOSIS — G8929 Other chronic pain: Secondary | ICD-10-CM | POA: Diagnosis not present

## 2023-04-17 DIAGNOSIS — J069 Acute upper respiratory infection, unspecified: Secondary | ICD-10-CM | POA: Diagnosis not present

## 2023-04-17 DIAGNOSIS — M545 Low back pain, unspecified: Secondary | ICD-10-CM | POA: Diagnosis not present

## 2023-04-17 DIAGNOSIS — R058 Other specified cough: Secondary | ICD-10-CM | POA: Diagnosis not present

## 2023-04-17 DIAGNOSIS — I10 Essential (primary) hypertension: Secondary | ICD-10-CM | POA: Diagnosis not present

## 2023-04-17 DIAGNOSIS — E119 Type 2 diabetes mellitus without complications: Secondary | ICD-10-CM | POA: Diagnosis not present

## 2023-04-17 DIAGNOSIS — Z03818 Encounter for observation for suspected exposure to other biological agents ruled out: Secondary | ICD-10-CM | POA: Diagnosis not present

## 2023-05-17 DIAGNOSIS — G4733 Obstructive sleep apnea (adult) (pediatric): Secondary | ICD-10-CM | POA: Diagnosis not present

## 2023-05-22 DIAGNOSIS — E785 Hyperlipidemia, unspecified: Secondary | ICD-10-CM | POA: Diagnosis not present

## 2023-05-22 DIAGNOSIS — I10 Essential (primary) hypertension: Secondary | ICD-10-CM | POA: Diagnosis not present

## 2023-05-22 DIAGNOSIS — E119 Type 2 diabetes mellitus without complications: Secondary | ICD-10-CM | POA: Diagnosis not present

## 2023-06-11 NOTE — Progress Notes (Unsigned)
Cardiology Office Note  Date:  06/12/2023   ID:  Elizabeth Barrett, DOB 09-Dec-1965, MRN [id number]  PCP:  Lynnea Ferrier, MD   Chief Complaint  Patient presents with   New Patient (Initial Visit)    Ref by Daniel Nones III, MD for chest pressure. Former Dr. Juliann Pares patient. Medications reviewed by the patient verbally.     HPI:  Elizabeth Barrett is a 57 year old woman with past medical history of Diabetes  hypertension Hyperlipidemia Sleep apnea COVID x 2 Who presents by referral from Dr. Daniel Nones for chest pressure  Referral placed March 23, 2023 Reports having COVID 3 years ago, shortness of breath and chest pressure since that time Recently, could not be set up for colonoscopy as she was having pressure in the chest, needs clearance Notes indicating long COVID, chronic issues with her lungs  Seen by primary care April 17, 2023, was having rib pain, acute URI Also recent Covid positive 10/24, general flu type symptoms Has recovered Continues to have chest discomfort central mediastinal area radiating through to her back, concerning for angina  CT scan chest November 2022 No acute findings, no mention of significant coronary disease or aortic atherosclerosis  Prior records requested and reviewed, normal echo February 2019 Stress test February 2019  A1Ac 7 Total chol 224, LDL 145  EKG personally reviewed by myself on todays visit EKG Interpretation Date/Time:  Monday June 12 2023 11:11:10 EST Ventricular Rate:  81 PR Interval:  142 QRS Duration:  76 QT Interval:  388 QTC Calculation: 450 R Axis:   -8  Text Interpretation: Normal sinus rhythm Poor R wave progression When compared with ECG of 16-Jul-2019 23:29, No significant change was found Confirmed by Julien Nordmann [phone]) on 06/12/2023 11:17:50 AM    PMH:   has a past medical history of Chest pain, Diabetes mellitus without complication (HCC), Hyperlipidemia, Hypertension, Malaise, Microscopic  hematuria, Obesity, and Vitamin D deficiency.  PSH:    Past Surgical History:  Procedure Laterality Date   ABDOMINAL HYSTERECTOMY     BREAST BIOPSY Left 6-7 YRS AGO   NEG   COLONOSCOPY WITH PROPOFOL N/A 07/25/2016   Procedure: COLONOSCOPY WITH PROPOFOL;  Surgeon: Scot Jun, MD;  Location: [facility] ENDOSCOPY;  Service: Endoscopy;  Laterality: N/A;   FRACTURE SURGERY Right 2002   elbow     Current Outpatient Medications  Medication Sig Dispense Refill   albuterol (VENTOLIN HFA) 108 (90 Base) MCG/ACT inhaler Inhale 1 puff into the lungs every 6 (six) hours as needed for wheezing or shortness of breath. 18 g 0   ALPRAZolam (XANAX) 0.5 MG tablet Take 0.5 mg by mouth at bedtime as needed.     ascorbic acid (VITAMIN C) 500 MG tablet Take 1 tablet (500 mg total) by mouth daily. 30 tablet 0   budesonide-formoterol (SYMBICORT) 160-4.5 MCG/ACT inhaler Inhale 2 puffs into the lungs.     cholecalciferol (VITAMIN D) 25 MCG (1000 UT) tablet Take 1 tablet (1,000 Units total) by mouth daily. 30 tablet 0   montelukast (SINGULAIR) 10 MG tablet Take 1 tablet by mouth at bedtime.     phentermine 37.5 MG capsule Take 37.5 mg by mouth every morning.     pravastatin (PRAVACHOL) 10 MG tablet Take 1 tablet by mouth daily.     tiZANidine (ZANAFLEX) 2 MG tablet Take 2 mg by mouth every 6 (six) hours as needed.     valsartan-hydrochlorothiazide (DIOVAN-HCT) 80-12.5 MG tablet Take 1 tablet by mouth daily.  zinc sulfate 220 (50 Zn) MG capsule Take 1 capsule (220 mg total) by mouth daily. 30 capsule 0   dexamethasone (DECADRON) 1 MG tablet 4 tabs po day1; 3 tabs po day2; 2 tabs po day3,4, 1 tab po day 5,6 (Patient not taking: Reported on 06/12/2023) 13 tablet 0   ipratropium (ATROVENT) 0.03 % nasal spray Place 1 spray into both nostrils 2 (two) times daily. (Patient not taking: Reported on 06/12/2023)     No current facility-administered medications for this visit.    Allergies:   Patient has no known allergies.    Social History:  The patient  reports that she has never smoked. She has never used smokeless tobacco. She reports current alcohol use. She reports that she does not use drugs.   Family History:   family history includes Asthma in her son; Cancer in her father; Diabetes in her father and son; Heart disease in her father; Hypertension in her mother.    Review of Systems: Review of Systems  Constitutional: Negative.   HENT: Negative.    Respiratory: Negative.    Cardiovascular: Negative.   Gastrointestinal: Negative.   Musculoskeletal: Negative.   Neurological: Negative.   Psychiatric/Behavioral: Negative.    All other systems reviewed and are negative.    PHYSICAL EXAM: VS:  BP (!) 130/90 (BP Location: Right Arm, Patient Position: Sitting, Cuff Size: Large)   Pulse 81   Ht 5' 5.5" (1.664 m)   Wt 258 lb 4 oz (117.1 kg)   SpO2 98%   BMI 42.32 kg/m  , BMI Body mass index is 42.32 kg/m. GEN: Well nourished, well developed, in no acute distress HEENT: normal Neck: no JVD, carotid bruits, or masses Cardiac: RRR; no murmurs, rubs, or gallops,no edema  Respiratory:  clear to auscultation bilaterally, normal work of breathing GI: soft, nontender, nondistended, + BS MS: no deformity or atrophy Skin: warm and dry, no rash Neuro:  Strength and sensation are intact Psych: euthymic mood, full affect  Recent Labs: No results found for requested labs within last 365 days.    Lipid Panel Lab Results  Component Value Date   CHOL 212 (A) 12/19/2012   HDL 61 12/19/2012   LDLCALC 134 12/19/2012   TRIG 83 12/19/2012      Wt Readings from Last 3 Encounters:  06/12/23 258 lb 4 oz (117.1 kg)  07/16/19 253 lb (114.8 kg)  04/26/18 263 lb (119.3 kg)     ASSESSMENT AND PLAN:  Problem List Items Addressed This Visit       Cardiology Problems   Essential hypertension - Primary   Relevant Orders   EKG 12-Lead (Completed)   Other Visit Diagnoses     Chest pain, unspecified  type       Relevant Orders   EKG 12-Lead (Completed)      Chest pain Chronic chest discomfort subxiphoid area radiating through to her back She is concerned for angina Risk factors including hyperlipidemia Given body habitus, Myoview would be less beneficial Cardiac CTA ordered for further evaluation and preop clearance for colonoscopy If cardiac CTA showing coronary calcifications or aortic atherosclerosis, would consider management of hyperlipidemia  Essential hypertension No changes made to hydrochlorothiazide and valsartan Recommend close monitoring of blood pressure at home  Hyperlipidemia Will review cardiac CTA results above closely for aortic atherosclerosis or coronary calcification May need to add statin for atherosclerosis Most recent total cholesterol 224 LDL 145  Morbid obesity We have encouraged continued exercise, careful diet management in an effort  to lose weight.  Patient seen in consultation for Dr. Daniel Nones and will be referred back to his office for ongoing care of the issues detailed above  Signed, Dossie Arbour, M.D., Ph.D. [facility] [place], [place] [phone]

## 2023-06-12 ENCOUNTER — Ambulatory Visit: Payer: 59 | Attending: Cardiovascular Disease | Admitting: Cardiovascular Disease

## 2023-06-12 ENCOUNTER — Encounter: Payer: Self-pay | Admitting: Cardiovascular Disease

## 2023-06-12 VITALS — BP 130/90 | HR 81 | Ht 65.5 in | Wt 258.2 lb

## 2023-06-12 DIAGNOSIS — E782 Mixed hyperlipidemia: Secondary | ICD-10-CM

## 2023-06-12 DIAGNOSIS — I209 Angina pectoris, unspecified: Secondary | ICD-10-CM | POA: Diagnosis not present

## 2023-06-12 DIAGNOSIS — R079 Chest pain, unspecified: Secondary | ICD-10-CM | POA: Diagnosis not present

## 2023-06-12 DIAGNOSIS — I10 Essential (primary) hypertension: Secondary | ICD-10-CM

## 2023-06-12 MED ORDER — METOPROLOL TARTRATE 100 MG PO TABS: 100.0000 mg | ORAL_TABLET | Freq: Once | ORAL | 0 refills | Status: AC

## 2023-06-12 NOTE — Patient Instructions (Addendum)
Medication Instructions:  No changes  If you need a refill on your cardiac medications before your next appointment, please call your pharmacy.   Lab work: No new labs needed  Testing/Procedures:   Your cardiac CT will be scheduled at one of the below locations:    [facility] [address] [place], [place] [address] [phone]  OR   [facility] [address] [place], [place] [address] [phone]   If scheduled at [facility] or [facility], please arrive 15 mins early for check-in and test prep.  There is spacious parking and easy access to the radiology department from the [facility] entrance. Please enter here and check-in with the desk attendant.   Please follow these instructions carefully (unless otherwise directed):  An IV will be required for this test and Nitroglycerin will be given.   On the Night Before the Test: Be sure to Drink plenty of water. Do not consume any caffeinated/decaffeinated beverages or chocolate 12 hours prior to your test. Do not take any antihistamines 12 hours prior to your test.  On the Day of the Test: Drink plenty of water until 1 hour prior to the test. Do not eat any food 1 hour prior to test. You may take your regular medications prior to the test.  Take metoprolol (Lopressor) 100 MG two hours prior to test. If you take Furosemide/Hydrochlorothiazide/Spironolactone, please HOLD on the morning of the test. FEMALES- please wear underwire-free bra if available, avoid dresses & tight clothing       After the Test: Drink plenty of water. After receiving IV contrast, you may experience a mild flushed feeling. This is normal. On occasion, you may experience a mild rash up to 24 hours after the test. This is not dangerous. If this occurs, you can take Benadryl 25 mg and increase your fluid intake. If  you experience trouble breathing, this can be serious. If it is severe call 911 IMMEDIATELY. If it is mild, please call our office.  We will call to schedule your test 2-4 weeks out understanding that some insurance companies will need an authorization prior to the service being performed.   For more information and frequently asked questions, please visit our website : [email or web address]  For non-scheduling related questions, please contact the cardiac imaging nurse navigator should you have any questions/concerns: Cardiac Imaging Nurse Navigators Direct Office Dial: [phone]   For scheduling needs, including cancellations and rescheduling, please call [place], [phone].   Follow-Up: At [facility], you and your health needs are our priority.  As part of our continuing mission to provide you with exceptional heart care, we have created designated Provider Care Teams.  These Care Teams include your primary Cardiologist (physician) and Advanced Practice Providers (APPs -  Physician Assistants and Nurse Practitioners) who all work together to provide you with the care you need, when you need it.  You will need a follow up appointment as needed  Providers on your designated Care Team:   Nicolasa Ducking, NP Eula Listen, PA-C Cadence Fransico Michael, [place]  COVID-19 Vaccine Information can be found at: [email or web address] For questions related to vaccine distribution or appointments, please email [email or web address] or call [phone].

## 2023-06-20 DIAGNOSIS — Z8249 Family history of ischemic heart disease and other diseases of the circulatory system: Secondary | ICD-10-CM | POA: Diagnosis not present

## 2023-06-20 DIAGNOSIS — I10 Essential (primary) hypertension: Secondary | ICD-10-CM | POA: Diagnosis not present

## 2023-06-20 DIAGNOSIS — E785 Hyperlipidemia, unspecified: Secondary | ICD-10-CM | POA: Diagnosis not present

## 2023-06-20 DIAGNOSIS — Z7951 Long term (current) use of inhaled steroids: Secondary | ICD-10-CM | POA: Diagnosis not present

## 2023-06-20 DIAGNOSIS — Z791 Long term (current) use of non-steroidal anti-inflammatories (NSAID): Secondary | ICD-10-CM | POA: Diagnosis not present

## 2023-06-20 DIAGNOSIS — Z809 Family history of malignant neoplasm, unspecified: Secondary | ICD-10-CM | POA: Diagnosis not present

## 2023-06-20 DIAGNOSIS — J45909 Unspecified asthma, uncomplicated: Secondary | ICD-10-CM | POA: Diagnosis not present

## 2023-06-20 DIAGNOSIS — I209 Angina pectoris, unspecified: Secondary | ICD-10-CM | POA: Diagnosis not present

## 2023-06-20 DIAGNOSIS — Z833 Family history of diabetes mellitus: Secondary | ICD-10-CM | POA: Diagnosis not present

## 2023-06-20 DIAGNOSIS — Z6841 Body Mass Index (BMI) 40.0 and over, adult: Secondary | ICD-10-CM | POA: Diagnosis not present

## 2023-06-22 ENCOUNTER — Encounter (HOSPITAL_COMMUNITY): Payer: Self-pay

## 2023-06-23 ENCOUNTER — Telehealth (HOSPITAL_COMMUNITY): Payer: Self-pay | Admitting: Emergency Medicine

## 2023-06-23 NOTE — Telephone Encounter (Signed)
Reaching out to patient to offer assistance regarding upcoming cardiac imaging study; pt verbalizes understanding of appt date/time, parking situation and where to check in, pre-test NPO status and medications ordered, and verified current allergies; name and call back number provided for further questions should they arise Rockwell Alexandria RN Navigator Cardiac Imaging [facility] [phone] office [phone] cell   Reminded to pick up metoprolol from her pharm

## 2023-06-26 ENCOUNTER — Ambulatory Visit
Admission: RE | Admit: 2023-06-26 | Discharge: 2023-06-26 | Disposition: A | Discharge status: Home / Self Care | Payer: 59 | Source: Ambulatory Visit | Attending: Cardiovascular Disease | Admitting: Cardiovascular Disease

## 2023-06-26 DIAGNOSIS — I209 Angina pectoris, unspecified: Secondary | ICD-10-CM | POA: Insufficient documentation

## 2023-06-26 MED ORDER — DILTIAZEM HCL 25 MG/5ML IV SOLN
10.0000 mg | INTRAVENOUS | Status: DC | PRN
  Filled 2023-06-26: qty 5

## 2023-06-26 MED ORDER — IOHEXOL 350 MG/ML SOLN
80.0000 mL | Freq: Once | INTRAVENOUS | Status: AC | PRN
  Administered 2023-06-26: 80 mL via INTRAVENOUS

## 2023-06-26 MED ORDER — NITROGLYCERIN 0.4 MG SL SUBL
0.8000 mg | SUBLINGUAL_TABLET | Freq: Once | SUBLINGUAL | Status: AC
Start: 2023-06-26 — End: 2023-06-26
  Administered 2023-06-26: 0.8 mg via SUBLINGUAL
  Filled 2023-06-26: qty 25

## 2023-06-26 MED ORDER — METOPROLOL TARTRATE 5 MG/5ML IV SOLN
10.0000 mg | Freq: Once | INTRAVENOUS | Status: DC | PRN
  Filled 2023-06-26: qty 10

## 2023-06-26 NOTE — Progress Notes (Signed)
Patient tolerated procedure well. Ambulate w/o difficulty. Denies any lightheadedness or being dizzy. Pt denies any pain at this time. Sitting in chair. Pt is encouraged to drink additional water throughout the day and reason explained to patient. Patient verbalized understanding and all questions answered. ABC intact. No further needs at this time. Discharge from procedure area w/o issues. 

## 2023-06-28 ENCOUNTER — Other Ambulatory Visit: Payer: Self-pay

## 2023-06-28 DIAGNOSIS — E119 Type 2 diabetes mellitus without complications: Secondary | ICD-10-CM | POA: Diagnosis not present

## 2023-06-28 DIAGNOSIS — G4733 Obstructive sleep apnea (adult) (pediatric): Secondary | ICD-10-CM | POA: Diagnosis not present

## 2023-06-28 DIAGNOSIS — Z1231 Encounter for screening mammogram for malignant neoplasm of breast: Secondary | ICD-10-CM | POA: Diagnosis not present

## 2023-06-28 DIAGNOSIS — I10 Essential (primary) hypertension: Secondary | ICD-10-CM | POA: Diagnosis not present

## 2023-06-28 DIAGNOSIS — F411 Generalized anxiety disorder: Secondary | ICD-10-CM | POA: Diagnosis not present

## 2023-06-28 DIAGNOSIS — E785 Hyperlipidemia, unspecified: Secondary | ICD-10-CM | POA: Diagnosis not present

## 2023-06-28 DIAGNOSIS — Z0000 Encounter for general adult medical examination without abnormal findings: Secondary | ICD-10-CM | POA: Diagnosis not present

## 2023-06-28 MED ORDER — MOUNJARO 2.5 MG/0.5ML ~~LOC~~ SOAJ
2.5000 mg | SUBCUTANEOUS | 3 refills | Status: AC
  Filled 2023-06-28 – 2023-09-18 (×2): qty 2, 28d supply, fill #0
  Filled 2023-10-09 – 2023-10-11 (×2): qty 2, 28d supply, fill #1
  Filled 2023-11-30: qty 2, 28d supply, fill #2

## 2023-06-28 MED ORDER — AZITHROMYCIN 250 MG PO TABS
ORAL_TABLET | ORAL | 0 refills | Status: AC
  Filled 2023-06-28: qty 6, 5d supply, fill #0

## 2023-06-29 ENCOUNTER — Other Ambulatory Visit: Payer: Self-pay

## 2023-07-03 ENCOUNTER — Encounter: Payer: Self-pay | Admitting: Emergency Medicine

## 2023-08-20 DIAGNOSIS — J984 Other disorders of lung: Secondary | ICD-10-CM | POA: Diagnosis not present

## 2023-08-20 DIAGNOSIS — R059 Cough, unspecified: Secondary | ICD-10-CM | POA: Diagnosis not present

## 2023-08-20 DIAGNOSIS — R0981 Nasal congestion: Secondary | ICD-10-CM | POA: Diagnosis not present

## 2023-08-20 DIAGNOSIS — J069 Acute upper respiratory infection, unspecified: Secondary | ICD-10-CM | POA: Diagnosis not present

## 2023-08-22 ENCOUNTER — Other Ambulatory Visit: Payer: Self-pay

## 2023-08-22 MED ORDER — AZITHROMYCIN 250 MG PO TABS
ORAL_TABLET | ORAL | 0 refills | Status: AC
  Filled 2023-08-22: qty 6, 5d supply, fill #0

## 2023-08-22 MED ORDER — GUAIFENESIN ER 600 MG PO TB12
600.0000 mg | ORAL_TABLET | Freq: Two times a day (BID) | ORAL | 0 refills | Status: AC | PRN
  Filled 2023-08-22: qty 20, 10d supply, fill #0

## 2023-08-22 MED ORDER — PREDNISONE 10 MG PO TABS
ORAL_TABLET | ORAL | 0 refills | Status: AC
  Filled 2023-08-22: qty 11, 10d supply, fill #0

## 2023-09-18 ENCOUNTER — Other Ambulatory Visit: Payer: Self-pay

## 2023-09-18 MED ORDER — MOUNJARO 2.5 MG/0.5ML ~~LOC~~ SOAJ
2.5000 mg | SUBCUTANEOUS | 3 refills | Status: DC
  Filled 2023-09-18: qty 2, 28d supply, fill #0

## 2023-10-09 ENCOUNTER — Other Ambulatory Visit: Payer: Self-pay

## 2023-10-11 ENCOUNTER — Other Ambulatory Visit: Payer: Self-pay

## 2023-11-02 ENCOUNTER — Other Ambulatory Visit: Payer: Self-pay

## 2023-11-02 MED ORDER — MOUNJARO 5 MG/0.5ML ~~LOC~~ SOAJ
5.0000 mg | SUBCUTANEOUS | 11 refills | Status: DC
Start: 2023-11-02 — End: 2023-12-28
  Filled 2023-11-02 – 2023-11-06 (×4): qty 2, 28d supply, fill #0
  Filled 2023-11-30: qty 2, 28d supply, fill #1

## 2023-11-06 ENCOUNTER — Other Ambulatory Visit (HOSPITAL_COMMUNITY): Payer: Self-pay

## 2023-11-06 ENCOUNTER — Other Ambulatory Visit: Payer: Self-pay

## 2023-11-06 DIAGNOSIS — G4733 Obstructive sleep apnea (adult) (pediatric): Secondary | ICD-10-CM | POA: Diagnosis not present

## 2023-11-06 DIAGNOSIS — J4530 Mild persistent asthma, uncomplicated: Secondary | ICD-10-CM | POA: Diagnosis not present

## 2023-11-06 DIAGNOSIS — Z6841 Body Mass Index (BMI) 40.0 and over, adult: Secondary | ICD-10-CM | POA: Diagnosis not present

## 2023-11-30 ENCOUNTER — Other Ambulatory Visit: Payer: Self-pay

## 2023-12-20 DIAGNOSIS — E119 Type 2 diabetes mellitus without complications: Secondary | ICD-10-CM | POA: Diagnosis not present

## 2023-12-20 DIAGNOSIS — E785 Hyperlipidemia, unspecified: Secondary | ICD-10-CM | POA: Diagnosis not present

## 2023-12-28 ENCOUNTER — Other Ambulatory Visit: Payer: Self-pay

## 2023-12-28 DIAGNOSIS — E119 Type 2 diabetes mellitus without complications: Secondary | ICD-10-CM | POA: Diagnosis not present

## 2023-12-28 DIAGNOSIS — Z6841 Body Mass Index (BMI) 40.0 and over, adult: Secondary | ICD-10-CM | POA: Diagnosis not present

## 2023-12-28 DIAGNOSIS — E785 Hyperlipidemia, unspecified: Secondary | ICD-10-CM | POA: Diagnosis not present

## 2023-12-28 DIAGNOSIS — F411 Generalized anxiety disorder: Secondary | ICD-10-CM | POA: Diagnosis not present

## 2023-12-28 DIAGNOSIS — G4733 Obstructive sleep apnea (adult) (pediatric): Secondary | ICD-10-CM | POA: Diagnosis not present

## 2023-12-28 DIAGNOSIS — Z90710 Acquired absence of both cervix and uterus: Secondary | ICD-10-CM | POA: Diagnosis not present

## 2023-12-28 MED ORDER — MOUNJARO 7.5 MG/0.5ML ~~LOC~~ SOAJ
7.5000 mg | SUBCUTANEOUS | 6 refills | Status: AC
  Filled 2023-12-28 – 2024-01-03 (×2): qty 2, 28d supply, fill #0
  Filled 2024-03-04: qty 2, 28d supply, fill #1
  Filled 2024-04-03: qty 2, 28d supply, fill #2
  Filled 2024-05-07: qty 2, 28d supply, fill #3
  Filled 2024-05-31: qty 2, 28d supply, fill #4

## 2024-01-03 ENCOUNTER — Other Ambulatory Visit: Payer: Self-pay

## 2024-01-03 MED ORDER — MOUNJARO 7.5 MG/0.5ML ~~LOC~~ SOAJ
7.5000 mg | SUBCUTANEOUS | 6 refills | Status: DC
  Filled 2024-01-03 – 2024-07-06 (×3): qty 2, 28d supply, fill #0

## 2024-03-04 ENCOUNTER — Other Ambulatory Visit: Payer: Self-pay

## 2024-04-03 ENCOUNTER — Other Ambulatory Visit (HOSPITAL_BASED_OUTPATIENT_CLINIC_OR_DEPARTMENT_OTHER): Payer: Self-pay

## 2024-04-03 ENCOUNTER — Other Ambulatory Visit: Payer: Self-pay

## 2024-04-09 ENCOUNTER — Other Ambulatory Visit: Payer: Self-pay

## 2024-04-11 ENCOUNTER — Other Ambulatory Visit: Payer: Self-pay

## 2024-04-11 MED ORDER — VALSARTAN-HYDROCHLOROTHIAZIDE 80-12.5 MG PO TABS
1.0000 | ORAL_TABLET | Freq: Every day | ORAL | 1 refills | Status: AC
  Filled 2024-04-11 – 2024-05-31 (×2): qty 90, 90d supply, fill #0

## 2024-04-22 ENCOUNTER — Other Ambulatory Visit: Payer: Self-pay

## 2024-05-07 ENCOUNTER — Other Ambulatory Visit: Payer: Self-pay

## 2024-05-08 ENCOUNTER — Other Ambulatory Visit: Payer: Self-pay

## 2024-05-08 MED ORDER — AZITHROMYCIN 250 MG PO TABS
ORAL_TABLET | ORAL | 0 refills | Status: AC
  Filled 2024-05-08: qty 6, 5d supply, fill #0

## 2024-05-31 ENCOUNTER — Other Ambulatory Visit: Payer: Self-pay

## 2024-07-07 ENCOUNTER — Other Ambulatory Visit: Payer: Self-pay

## 2024-07-09 ENCOUNTER — Other Ambulatory Visit: Payer: Self-pay

## 2024-07-09 MED ORDER — MOUNJARO 10 MG/0.5ML ~~LOC~~ SOAJ
10.0000 mg | SUBCUTANEOUS | 11 refills | Status: AC
  Filled 2024-07-09 (×2): qty 2, 28d supply, fill #0

## 2024-07-09 MED ORDER — PRAVASTATIN SODIUM 10 MG PO TABS
10.0000 mg | ORAL_TABLET | Freq: Every day | ORAL | 3 refills | Status: AC
  Filled 2024-07-09: qty 90, 90d supply, fill #0

## 2024-07-16 ENCOUNTER — Other Ambulatory Visit: Payer: Self-pay

## 2024-07-16 MED ORDER — NA SULFATE-K SULFATE-MG SULF 17.5-3.13-1.6 GM/177ML PO SOLN
ORAL | 0 refills | Status: AC
  Filled 2024-07-16: qty 354, 1d supply, fill #0

## 2024-07-25 ENCOUNTER — Encounter: Admission: RE | Disposition: A | Payer: Self-pay | Source: Home / Self Care | Attending: Gastroenterology

## 2024-07-25 ENCOUNTER — Ambulatory Visit

## 2024-07-25 ENCOUNTER — Other Ambulatory Visit: Payer: Self-pay

## 2024-07-25 ENCOUNTER — Ambulatory Visit
Admission: RE | Admit: 2024-07-25 | Discharge: 2024-07-25 | Disposition: A | Discharge status: Home / Self Care | Attending: Gastroenterology | Admitting: Gastroenterology

## 2024-07-25 ENCOUNTER — Encounter: Payer: Self-pay | Admitting: Gastroenterology

## 2024-07-25 DIAGNOSIS — K640 First degree hemorrhoids: Secondary | ICD-10-CM | POA: Insufficient documentation

## 2024-07-25 DIAGNOSIS — Z6841 Body Mass Index (BMI) 40.0 and over, adult: Secondary | ICD-10-CM | POA: Insufficient documentation

## 2024-07-25 DIAGNOSIS — Z7985 Long-term (current) use of injectable non-insulin antidiabetic drugs: Secondary | ICD-10-CM | POA: Insufficient documentation

## 2024-07-25 DIAGNOSIS — I10 Essential (primary) hypertension: Secondary | ICD-10-CM | POA: Diagnosis not present

## 2024-07-25 DIAGNOSIS — E669 Obesity, unspecified: Secondary | ICD-10-CM | POA: Insufficient documentation

## 2024-07-25 DIAGNOSIS — Z83719 Family history of colon polyps, unspecified: Secondary | ICD-10-CM | POA: Insufficient documentation

## 2024-07-25 DIAGNOSIS — Z1211 Encounter for screening for malignant neoplasm of colon: Secondary | ICD-10-CM | POA: Diagnosis not present

## 2024-07-25 DIAGNOSIS — E119 Type 2 diabetes mellitus without complications: Secondary | ICD-10-CM | POA: Insufficient documentation

## 2024-07-25 DIAGNOSIS — K5730 Diverticulosis of large intestine without perforation or abscess without bleeding: Secondary | ICD-10-CM | POA: Insufficient documentation

## 2024-07-25 DIAGNOSIS — Z860101 Personal history of adenomatous and serrated colon polyps: Secondary | ICD-10-CM | POA: Diagnosis present

## 2024-07-25 HISTORY — PX: COLONOSCOPY: SHX5424

## 2024-07-25 MED ORDER — LIDOCAINE HCL (CARDIAC) PF 100 MG/5ML IV SOSY
PREFILLED_SYRINGE | INTRAVENOUS | Status: DC | PRN
  Administered 2024-07-25: 80 mg via INTRAVENOUS

## 2024-07-25 MED ORDER — SODIUM CHLORIDE 0.9 % IV SOLN: INTRAVENOUS | Status: DC

## 2024-07-25 MED ORDER — PROPOFOL 500 MG/50ML IV EMUL
INTRAVENOUS | Status: DC | PRN
  Administered 2024-07-25: 75 ug/kg/min via INTRAVENOUS

## 2024-07-25 MED ORDER — PROPOFOL 10 MG/ML IV BOLUS
INTRAVENOUS | Status: DC | PRN
  Administered 2024-07-25 (×2): 50 mg via INTRAVENOUS

## 2024-07-25 MED ORDER — DEXMEDETOMIDINE HCL IN NACL 80 MCG/20ML IV SOLN
INTRAVENOUS | Status: DC | PRN
  Administered 2024-07-25: 8 ug via INTRAVENOUS
  Administered 2024-07-25: 12 ug via INTRAVENOUS

## 2024-07-25 MED ORDER — GLYCOPYRROLATE 0.2 MG/ML IJ SOLN
INTRAMUSCULAR | Status: AC
  Filled 2024-07-25: qty 2

## 2024-07-25 MED ORDER — LIDOCAINE HCL (PF) 2 % IJ SOLN
INTRAMUSCULAR | Status: AC
  Filled 2024-07-25: qty 10

## 2024-07-25 MED ORDER — DEXMEDETOMIDINE HCL IN NACL 80 MCG/20ML IV SOLN
INTRAVENOUS | Status: AC
  Filled 2024-07-25: qty 40

## 2024-07-25 NOTE — Anesthesia Postprocedure Evaluation (Signed)
"   Anesthesia Post Note  Patient: Elizabeth Barrett  Procedure(s) Performed: COLONOSCOPY  Patient location during evaluation: Endoscopy Anesthesia Type: General Level of consciousness: awake and alert Pain management: pain level controlled Vital Signs Assessment: post-procedure vital signs reviewed and stable Respiratory status: spontaneous breathing, nonlabored ventilation, respiratory function stable and patient connected to nasal cannula oxygen Cardiovascular status: blood pressure returned to baseline and stable Postop Assessment: no apparent nausea or vomiting Anesthetic complications: no   No notable events documented.   Last Vitals:  Vitals:   07/25/24 1057 07/25/24 1107  BP: 131/65 117/83  Pulse: 74 69  Resp: 19 16  Temp:    SpO2: 98% 97%    Last Pain:  Vitals:   07/25/24 1107  TempSrc:   PainSc: 0-No pain                 [name]      "

## 2024-07-25 NOTE — Transfer of Care (Signed)
 Immediate Anesthesia Transfer of Care Note  Patient: Elizabeth Barrett  Procedure(s) Performed: COLONOSCOPY  Patient Location: PACU  Anesthesia Type:General  Level of Consciousness: sedated  Airway & Oxygen Therapy: Patient Spontanous Breathing  Post-op Assessment: Report given to RN and Post -op Vital signs reviewed and stable  Post vital signs: Reviewed and stable  Last Vitals:  Vitals Value Taken Time  BP 113/66 07/25/24 10:46  Temp    Pulse 77 07/25/24 10:47  Resp 13 07/25/24 10:47  SpO2 98 % 07/25/24 10:47  Vitals shown include unfiled device data.  Last Pain:  Vitals:   07/25/24 1009  TempSrc: Temporal  PainSc: 0-No pain         Complications: No notable events documented.

## 2024-07-25 NOTE — Op Note (Signed)
 [facility] Gastroenterology Patient Name: Elizabeth Barrett Procedure Date: 07/25/2024 10:21 AM MRN: [id number] Account #: [id number] Date of Birth: Aug 12, 1965 Admit Type: Outpatient Age: 59 Room: [facility] ENDO ROOM 1 Gender: Female Note Status: Finalized Instrument Name: Colon Scope [id number] Procedure:             Colonoscopy Indications:           High risk colon cancer surveillance: Personal history                         of adenoma with villous component, Family history of                         colon polyps (mother and father) Providers:             Elspeth Ozell Onita ROSALEA, DO Referring MD:          Ophelia Sage, MD (Referring MD) Medicines:             Monitored Anesthesia Care Complications:         No immediate complications. Estimated blood loss: None. Procedure:             Pre-Anesthesia Assessment:                        - Prior to the procedure, a History and Physical was                         performed, and patient medications and allergies were                         reviewed. The patient is competent. The risks and                         benefits of the procedure and the sedation options and                         risks were discussed with the patient. All questions                         were answered and informed consent was obtained.                         Patient identification and proposed procedure were                         verified by the physician, the nurse, the anesthetist                         and the technician in the endoscopy suite. Mental                         Status Examination: alert and oriented. Airway                         Examination: normal oropharyngeal airway and neck                         mobility. Respiratory Examination: clear to  auscultation. CV Examination: RRR, no murmurs, no S3                         or S4. Prophylactic Antibiotics: The patient does not                         require  prophylactic antibiotics. Prior                         Anticoagulants: The patient has taken no anticoagulant                         or antiplatelet agents. ASA Grade Assessment: II - A                         patient with mild systemic disease. After reviewing                         the risks and benefits, the patient was deemed in                         satisfactory condition to undergo the procedure. The                         anesthesia plan was to use monitored anesthesia care                         (MAC). Immediately prior to administration of                         medications, the patient was re-assessed for adequacy                         to receive sedatives. The heart rate, respiratory                         rate, oxygen saturations, blood pressure, adequacy of                         pulmonary ventilation, and response to care were                         monitored throughout the procedure. The physical                         status of the patient was re-assessed after the                         procedure.                        After obtaining informed consent, the colonoscope was                         passed under direct vision. Throughout the procedure,                         the patient's blood pressure, pulse, and oxygen  saturations were monitored continuously. The was                         introduced through the anus and advanced to the the                         terminal ileum, with identification of the appendiceal                         orifice and IC valve. The colonoscopy was performed                         without difficulty. The patient tolerated the                         procedure well. The quality of the bowel preparation                         was evaluated using the BBPS [facility] Bowel Preparation                         Scale) with scores of: Right Colon = 2 (minor amount                         of residual  staining, small fragments of stool and/or                         opaque liquid, but mucosa seen well), Transverse Colon                         = 3 (entire mucosa seen well with no residual                         staining, small fragments of stool or opaque liquid)                         and Left Colon = 3 (entire mucosa seen well with no                         residual staining, small fragments of stool or opaque                         liquid). The total BBPS score equals 8. The quality of                         the bowel preparation was excellent. The terminal                         ileum, ileocecal valve, appendiceal orifice, and                         rectum were photographed. Findings:      The perianal and digital rectal examinations were normal. Pertinent       negatives include normal sphincter tone.      The terminal ileum appeared normal. Estimated blood loss: none.      Retroflexion in the right colon was  performed.      A few small-mouthed diverticula were found in the sigmoid colon.       Estimated blood loss: none.      Non-bleeding internal hemorrhoids were found during retroflexion. The       hemorrhoids were Grade I (internal hemorrhoids that do not prolapse).       Estimated blood loss: none.      The exam was otherwise without abnormality on direct and retroflexion       views. Impression:            - The examined portion of the ileum was normal.                        - Diverticulosis in the sigmoid colon.                        - Non-bleeding internal hemorrhoids.                        - The examination was otherwise normal on direct and                         retroflexion views.                        - No specimens collected. Recommendation:        - Patient has a contact number available for                         emergencies. The signs and symptoms of potential                         delayed complications were discussed with the patient.                          Return to normal activities tomorrow. Written                         discharge instructions were provided to the patient.                        - Discharge patient to home.                        - Resume previous diet.                        - Continue present medications.                        - Resume mounjaro  on date of choice                        - Repeat colonoscopy in 5 years for surveillance.                        - Return to referring physician as previously                         scheduled.                        -  The findings and recommendations were discussed with                         the patient. Procedure Code(s):     --- Professional ---                        [phone], Colonoscopy, flexible; diagnostic, including                         collection of specimen(s) by brushing or washing, when                         performed (separate procedure) Diagnosis Code(s):     --- Professional ---                        Z86.010, Personal history of colonic polyps                        K64.0, First degree hemorrhoids                        K57.30, Diverticulosis of large intestine without                         perforation or abscess without bleeding CPT copyright 2022 American Medical Association. All rights reserved. The codes documented in this report are preliminary and upon coder review may  be revised to meet current compliance requirements. Attending Participation:      I personally performed the entire procedure. Elspeth Jungling, DO Elspeth Ozell Jungling DO, DO 07/25/2024 10:47:57 AM This report has been signed electronically. Number of Addenda: 0 Note Initiated On: 07/25/2024 10:21 AM Scope Withdrawal Time: 0 hours 7 minutes 59 seconds  Total Procedure Duration: 0 hours 10 minutes 0 seconds  Estimated Blood Loss:  Estimated blood loss: none.      [facility]

## 2024-07-25 NOTE — Anesthesia Preprocedure Evaluation (Signed)
"                                    Anesthesia Evaluation  Patient identified by MRN, date of birth, ID band Patient awake    Reviewed: Allergy & Precautions, H&P , NPO status , Patient's Chart, lab work & pertinent test results  Airway Mallampati: II  TM Distance: >3 FB Neck ROM: Full    Dental no notable dental hx.    Pulmonary neg pulmonary ROS   Pulmonary exam normal breath sounds clear to auscultation       Cardiovascular hypertension, negative cardio ROS Normal cardiovascular exam Rhythm:Regular Rate:Normal     Neuro/Psych negative neurological ROS  negative psych ROS   GI/Hepatic negative GI ROS, Neg liver ROS,,,  Endo/Other  negative endocrine ROSdiabetes    Renal/GU negative Renal ROS  negative genitourinary   Musculoskeletal negative musculoskeletal ROS (+)    Abdominal   Peds negative pediatric ROS (+)  Hematology negative hematology ROS (+)   Anesthesia Other Findings   Reproductive/Obstetrics negative OB ROS                              Anesthesia Physical Anesthesia Plan  ASA: 3  Anesthesia Plan: General   Post-op Pain Management:    Induction: Intravenous  PONV Risk Score and Plan:   Airway Management Planned:   Additional Equipment:   Intra-op Plan:   Post-operative Plan: Extubation in OR  Informed Consent: I have reviewed the patients History and Physical, chart, labs and discussed the procedure including the risks, benefits and alternatives for the proposed anesthesia with the patient or authorized representative who has indicated his/her understanding and acceptance.     Dental advisory given  Plan Discussed with: CRNA  Anesthesia Plan Comments:         Anesthesia Quick Evaluation  "

## 2024-07-25 NOTE — H&P (Signed)
 "  Pre-Procedure H&P   Patient ID: [facility] is a 59 y.o. female.  Gastroenterology Provider: Elspeth Ozell Jungling, DO  Referring Provider: Dr. Fernande [facility]  Date: 07/25/2024  HPI Elizabeth [facility] is a 59 y.o. female who presents today for Colonoscopy for Personal history of advanced colon polyps; family history of colon polyp .   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  No colorectal cancer history  Denies any other GI symptoms.  Mounjaro  is been held for the procedure (last dose 9 days ago)   Past Medical History:  Diagnosis Date   Chest pain    Diabetes mellitus without complication (HCC)    Hyperlipidemia    Hypertension    Malaise    Microscopic hematuria    Obesity    Vitamin D deficiency     Past Surgical History:  Procedure Laterality Date   ABDOMINAL HYSTERECTOMY     BREAST BIOPSY Left 6-7 YRS AGO   NEG   COLONOSCOPY WITH PROPOFOL  N/A 07/25/2016   Procedure: COLONOSCOPY WITH PROPOFOL ;  Surgeon: Elizabeth ONEIDA Holmes, [facility];  [facility];  Service: Endoscopy;  Laterality: N/A;   FRACTURE SURGERY Right 2002   elbow     Family History Mother and father- colon polyps No other h/o GI disease or malignancy  Review of Systems  Constitutional:  Negative for activity change, appetite change, chills, diaphoresis, fatigue, fever and unexpected weight change.  HENT:  Negative for trouble swallowing and voice change.   Respiratory:  Negative for shortness of breath and wheezing.   Cardiovascular:  Negative for chest pain, palpitations and leg swelling.  Gastrointestinal:  Negative for abdominal distention, abdominal pain, anal bleeding, blood in stool, constipation, diarrhea, nausea, rectal pain and vomiting.  Musculoskeletal:  Negative for arthralgias and myalgias.  Skin:  Negative for color change and pallor.   Neurological:  Negative for dizziness, syncope and weakness.  Psychiatric/Behavioral:  Negative for confusion.   All other systems reviewed and are negative.    Medications Medications Ordered Prior to Encounter[1]  Pertinent medications related to GI and procedure were reviewed by me with the patient prior to the procedure  Current Medications[2]  sodium chloride  20 mL/hr at 07/25/24 1014       Allergies[3] Allergies were reviewed by me prior to the procedure  Objective   Body mass index is 40.34 kg/m. Vitals:   07/25/24 1009  BP: 138/76  Pulse: 85  Resp: 16  Temp: (!) 96.2 F (35.7 C)  TempSrc: Temporal  SpO2: 98%  Weight: 106.6 kg  Height: 5' 4 (1.626 m)     Physical Exam Vitals and nursing note reviewed.  Constitutional:      General: She is not in acute distress.    Appearance: Normal appearance. She is obese. She is not ill-appearing, toxic-appearing or diaphoretic.  HENT:     Head: Normocephalic and atraumatic.     Nose: Nose normal.     Mouth/Throat:     Mouth: Mucous membranes are moist.     Pharynx: Oropharynx is clear.  Eyes:     General: No scleral icterus.    Extraocular Movements: Extraocular movements intact.  Cardiovascular:     Rate and Rhythm: Normal rate and regular rhythm.     Heart sounds: Normal heart sounds. No murmur heard.    No friction rub. No gallop.  Pulmonary:  Effort: Pulmonary effort is normal. No respiratory distress.     Breath sounds: Normal breath sounds. No wheezing, rhonchi or rales.  Abdominal:     General: Bowel sounds are normal. There is no distension.     Palpations: Abdomen is soft.     Tenderness: There is no abdominal tenderness. There is no guarding or rebound.  Musculoskeletal:     Cervical back: Neck supple.     Right lower leg: No edema.     Left lower leg: No edema.  Skin:    General: Skin is warm and dry.     Coloration: Skin is not jaundiced or pale.  Neurological:     General: No focal  deficit present.     Mental Status: She is alert and oriented to person, place, and time. Mental status is at baseline.  Psychiatric:        Mood and Affect: Mood normal.        Behavior: Behavior normal.        Thought Content: Thought content normal.        Judgment: Judgment normal.      Assessment:  [facility] is a 59 y.o. female  who presents today for Colonoscopy for Personal history of advanced colon polyps; family history of colon polyp .  Plan:  Colonoscopy with possible intervention today  Colonoscopy with possible biopsy, control of bleeding, polypectomy, and interventions as necessary has been discussed with the patient/patient representative. Informed consent was obtained from the patient/patient representative after explaining the indication, nature, and risks of the procedure including but not limited to death, bleeding, perforation, missed neoplasm/lesions, cardiorespiratory compromise, and reaction to medications. Opportunity for questions was given and appropriate answers were provided. Patient/patient representative has verbalized understanding is amenable to undergoing the procedure.   Elizabeth Ozell Jungling, DO  [facility]  Portions of the record may have been created with voice recognition software. Occasional wrong-word or 'sound-a-like' substitutions may have occurred due to the inherent limitations of voice recognition software.  Read the chart carefully and recognize, using context, where substitutions may have occurred.     [1]  No current facility-administered medications on file prior to encounter.   Current Outpatient Medications on File Prior to Encounter  Medication Sig Dispense Refill   ALPRAZolam (XANAX) 0.5 MG tablet Take 0.5 mg by mouth at bedtime as needed.     ascorbic acid  (VITAMIN C) 500 MG tablet Take 1 tablet (500 mg total) by mouth daily. 30 tablet 0   cholecalciferol  (VITAMIN D) 25 MCG (1000 UT) tablet Take 1  tablet (1,000 Units total) by mouth daily. 30 tablet 0   pravastatin  (PRAVACHOL ) 10 MG tablet Take 1 tablet by mouth daily.     valsartan -hydrochlorothiazide  (DIOVAN -HCT) 80-12.5 MG tablet Take 1 tablet by mouth daily. 90 tablet 1   zinc  sulfate 220 (50 Zn) MG capsule Take 1 capsule (220 mg total) by mouth daily. 30 capsule 0   albuterol  (VENTOLIN  HFA) 108 (90 Base) MCG/ACT inhaler Inhale 1 puff into the lungs every 6 (six) hours as needed for wheezing or shortness of breath. 18 g 0   budesonide-formoterol (SYMBICORT) 160-4.5 MCG/ACT inhaler Inhale 2 puffs into the lungs.     dexamethasone  (DECADRON ) 1 MG tablet 4 tabs po day1; 3 tabs po day2; 2 tabs po day3,4, 1 tab po day 5,6 (Patient not taking: Reported on 06/12/2023) 13 tablet 0   ipratropium (ATROVENT) 0.03 % nasal spray Place 1 spray into both nostrils 2 (  two) times daily. (Patient not taking: Reported on 06/12/2023)     metoprolol  tartrate (LOPRESSOR ) 100 MG tablet Take 1 tablet (100 mg total) by mouth once for 1 dose. Take 2 hours prior to cardiac CT 1 tablet 0   montelukast (SINGULAIR) 10 MG tablet Take 1 tablet by mouth at bedtime.     Na Sulfate-K Sulfate-Mg Sulfate concentrate (SUPREP) 17.5-3.13-1.6 GM/177ML SOLN Take 1 Bottle by mouth as directed One kit contains 2 bottles.  Take both bottles at the times instructed by your provider. 354 mL 0   phentermine 37.5 MG capsule Take 37.5 mg by mouth every morning.     pravastatin  (PRAVACHOL ) 10 MG tablet Take 1 tablet (10 mg total) by mouth at bedtime 90 tablet 3   tirzepatide  (MOUNJARO ) 10 MG/0.5ML Pen Inject 10 mg into the skin once a week. 2 mL 11   tirzepatide  (MOUNJARO ) 2.5 MG/0.5ML Pen Inject 2.5 mg into the skin once a week. (Patient not taking: Reported on 07/25/2024) 2 mL 3   tirzepatide  (MOUNJARO ) 7.5 MG/0.5ML Pen Inject 7.5 mg (0.5 mls) into the skin once a week. 2 mL 6   valsartan -hydrochlorothiazide  (DIOVAN -HCT) 80-12.5 MG tablet Take 1 tablet by mouth daily.    [2]  Current  Facility-Administered Medications:    0.9 %  sodium chloride  infusion, , Intravenous, Continuous, Onita Elizabeth Sharper, DO, Last Rate: 20 mL/hr at 07/25/24 1014, New Bag at 07/25/24 1014 [3] No Known Allergies  "

## 2024-07-25 NOTE — Interval H&P Note (Signed)
 History and Physical Interval Note: Preprocedure H&P from 07/25/24  was reviewed and there was no interval change after seeing and examining the patient.  Written consent was obtained from the patient after discussion of risks, benefits, and alternatives. Patient has consented to proceed with Colonoscopy with possible intervention   07/25/2024 10:24 AM  Jon JAYSON Budge  has presented today for surgery, with the diagnosis of Colon cancer screening (Z12.11) History of adenomatous polyp of colon (Z86.0101) Family history of colonic polyps (Z83.719).  The various methods of treatment have been discussed with the patient and family. After consideration of risks, benefits and other options for treatment, the patient has consented to  Procedures: COLONOSCOPY (N/A) as a surgical intervention.  The patient's history has been reviewed, patient examined, no change in status, stable for surgery.  I have reviewed the patient's chart and labs.  Questions were answered to the patient's satisfaction.     Elspeth Ozell Jungling
